# Patient Record
Sex: Male | Born: 1990 | Race: Black or African American | Hispanic: No | Marital: Single | Smoking: Former smoker
Health system: Southern US, Community
[De-identification: ages and names within clinical notes are randomized; demographics above are authoritative.]

## PROBLEM LIST (undated history)

## (undated) DIAGNOSIS — F901 Attention-deficit hyperactivity disorder, predominantly hyperactive type: Secondary | ICD-10-CM

## (undated) DIAGNOSIS — J45909 Unspecified asthma, uncomplicated: Secondary | ICD-10-CM

---
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
## 2012-07-09 ENCOUNTER — Emergency Department (INDEPENDENT_AMBULATORY_CARE_PROVIDER_SITE_OTHER)
Admission: EM | Admit: 2012-07-09 | Discharge: 2012-07-09 | Disposition: A | Discharge status: Home / Self Care | Payer: Medicaid Other | Source: Home / Self Care

## 2012-07-09 ENCOUNTER — Encounter (HOSPITAL_COMMUNITY): Payer: Self-pay | Admitting: *Deleted

## 2012-07-09 DIAGNOSIS — B349 Viral infection, unspecified: Secondary | ICD-10-CM

## 2012-07-09 DIAGNOSIS — B9789 Other viral agents as the cause of diseases classified elsewhere: Secondary | ICD-10-CM

## 2012-07-09 DIAGNOSIS — R197 Diarrhea, unspecified: Secondary | ICD-10-CM

## 2012-07-09 DIAGNOSIS — J111 Influenza due to unidentified influenza virus with other respiratory manifestations: Secondary | ICD-10-CM

## 2012-07-09 HISTORY — DX: Unspecified asthma, uncomplicated: J45.909

## 2012-07-09 HISTORY — DX: Attention-deficit hyperactivity disorder, predominantly hyperactive type: F90.1

## 2012-07-09 LAB — POCT RAPID STREP A: Streptococcus, Group A Screen (Direct): NEGATIVE

## 2012-07-09 MED ORDER — KETOROLAC TROMETHAMINE 60 MG/2ML IM SOLN
60.0000 mg | Freq: Once | INTRAMUSCULAR | Status: AC
  Administered 2012-07-09: 60 mg via INTRAMUSCULAR

## 2012-07-09 MED ORDER — GUAIFENESIN-CODEINE 100-10 MG/5ML PO SYRP: 5.0000 mL | ORAL_SOLUTION | Freq: Four times a day (QID) | ORAL | Status: DC | PRN

## 2012-07-09 NOTE — ED Provider Notes (Signed)
Medical screening examination/treatment/procedure(s) were performed by resident physician or non-physician practitioner and as supervising physician I was immediately available for consultation/collaboration.   Alireza Pollack DOUGLAS MD.    Dieter Hane D Jaceyon Strole, MD 07/09/12 1848 

## 2012-07-09 NOTE — Discharge Instructions (Signed)
Clear Liquid Diet The clear liquid dietconsists of foods that are liquid or will become liquid at room temperature.You should be able to see through the liquid and beverages. Examples of foods allowed on a clear liquid diet include fruit juice, broth or bouillon, gelatin, or frozen ice pops. The purpose of this diet is to provide necessary fluid, electrolytes such as sodium and potassium, and energy to keep the body functioning during times when you are not able to consume a regular diet.A clear liquid diet should not be continued for long periods of time as it is not nutritionally adequate.  REASONS FOR USING A CLEAR LIQUID DIET  In sudden onset (acute) conditions for a patient before or after surgery.  As the first step in oral feeding.  For fluid and electrolyte replacement in diarrheal diseases.  As a diet before certain medical tests are performed. ADEQUACY The clear liquid diet is adequate only in ascorbic acid, according to the Recommended Dietary Allowances of the [employer]. CHOOSING FOODS Breads and Starches  Allowed:  None are allowed.  Avoid: All are avoided. Vegetables  Allowed:  Strained tomato or vegetable juice.  Avoid: Any others. Fruit  Allowed:  Strained fruit juices and fruit drinks. Include 1 serving of citrus or vitamin C-enriched fruit juice daily.  Avoid: Any others. Meat and Meat Substitutes  Allowed:  None are allowed.  Avoid: All are avoided. Milk  Allowed:  None are allowed.  Avoid: All are avoided. Soups and Combination Foods  Allowed:  Clear bouillon, broth, or strained broth-based soups.  Avoid: Any others. Desserts and Sweets  Allowed:  Sugar, honey. High protein gelatin. Flavored gelatin, ices, or frozen ice pops that do not contain milk.  Avoid: Any others. Fats and Oils  Allowed:  None are allowed.  Avoid: All are avoided. Beverages  Allowed: Cereal beverages, coffee (regular or decaffeinated), tea, or soda  at the discretion of your caregiver.  Avoid: Any others. Condiments  Allowed:  Iodized salt.  Avoid: Any others, including pepper. Supplements  Allowed:  Liquid nutrition beverages.  Avoid: Any others that contain lactose or fiber. SAMPLE MEAL PLAN Breakfast  4 oz (120 mL) strained orange juice.   to 1 cup (125 to 250 mL) gelatin (plain or fortified).  1 cup (250 mL) beverage (coffee or tea).  Sugar, if desired. Midmorning Snack   cup (125 mL) gelatin (plain or fortified). Lunch  1 cup (250 mL) broth or consomm.  4 oz (120 mL) strained grapefruit juice.   cup (125 mL) gelatin (plain or fortified).  1 cup (250 mL) beverage (coffee or tea).  Sugar, if desired. Midafternoon Snack   cup (125 mL) fruit ice.   cup (125 mL) strained fruit juice. Dinner  1 cup (250 mL) broth or consomm.   cup (125 mL) cranberry juice.   cup (125 mL) flavored gelatin (plain or fortified).  1 cup (250 mL) beverage (coffee or tea).  Sugar, if desired. Evening Snack  4 oz (120 mL) strained apple juice (vitamin C-fortified).   cup (125 mL) flavored gelatin (plain or fortified). Document Released: 07/11/2005 Document Revised: 10/03/2011 Document Reviewed: 10/08/2010 [facility] Patient Information 2013 [place], [place]. B.R.A.T. Diet Your doctor has recommended the B.R.A.T. diet for you or your child until the condition improves. This is often used to help control diarrhea and vomiting symptoms. If you or your child can tolerate clear liquids, you may have:  Bananas.   Rice.   Applesauce.   Toast (and other simple starches  such as crackers, potatoes, noodles).  Be sure to avoid dairy products, meats, and fatty foods until symptoms are better. Fruit juices such as apple, grape, and prune juice can make diarrhea worse. Avoid these. Continue this diet for 2 days or as instructed by your caregiver. Document Released: 07/11/2005 Document Revised: 06/30/2011 Document Reviewed:  12/28/2006 [facility] Patient Information 2012 [place], [place].Influenza Facts Flu (influenza) is a contagious respiratory illness caused by the influenza viruses. It can cause mild to severe illness. While most healthy people recover from the flu without specific treatment and without complications, older people, young children, and people with certain health conditions are at higher risk for serious complications from the flu, including death. CAUSES   The flu virus is spread from person to person by respiratory droplets from coughing and sneezing.  A person can also become infected by touching an object or surface with a virus on it and then touching their mouth, eye or nose.  Adults may be able to infect others from 1 day before symptoms occur and up to 7 days after getting sick. So it is possible to give someone the flu even before you know you are sick and continue to infect others while you are sick. SYMPTOMS   Fever (usually high).  Headache.  Tiredness (can be extreme).  Cough.  Sore throat.  Runny or stuffy nose.  Body aches.  Diarrhea and vomiting may also occur, particularly in children.  These symptoms are referred to as "flu-like symptoms". A lot of different illnesses, including the common cold, can have similar symptoms. DIAGNOSIS   There are tests that can determine if you have the flu as long you are tested within the first 2 or 3 days of illness.  A doctor's exam and additional tests may be needed to identify if you have a disease that is a complicating the flu. RISKS AND COMPLICATIONS  Some of the complications caused by the flu include:  Bacterial pneumonia or progressive pneumonia caused by the flu virus.  Loss of body fluids (dehydration).  Worsening of chronic medical conditions, such as heart failure, asthma, or diabetes.  Sinus problems and ear infections. HOME CARE INSTRUCTIONS   Seek medical care early on.  If you are at high risk from  complications of the flu, consult your health-care provider as soon as you develop flu-like symptoms. Those at high risk for complications include:  People 65 years or older.  People with chronic medical conditions, including diabetes.  Pregnant women.  Young children.  Your caregiver may recommend use of an antiviral medication to help treat the flu.  If you get the flu, get plenty of rest, drink a lot of liquids, and avoid using alcohol and tobacco.  You can take over-the-counter medications to relieve the symptoms of the flu if your caregiver approves. (Never give aspirin to children or teenagers who have flu-like symptoms, particularly fever). PREVENTION  The single best way to prevent the flu is to get a flu vaccine each fall. Other measures that can help protect against the flu are:  Antiviral Medications  A number of antiviral drugs are approved for use in preventing the flu. These are prescription medications, and a doctor should be consulted before they are used.  Habits for Good Health  Cover your nose and mouth with a tissue when you cough or sneeze, throw the tissue away after you use it.  Wash your hands often with soap and water, especially after you cough or sneeze. If you are  not near water, use an alcohol-based hand cleaner.  Avoid people who are sick.  If you get the flu, stay home from work or school. Avoid contact with other people so that you do not make them sick, too.  Try not to touch your eyes, nose, or mouth as germs ore often spread this way. IN CHILDREN, EMERGENCY WARNING SIGNS THAT NEED URGENT MEDICAL ATTENTION:  Fast breathing or trouble breathing.  Bluish skin color.  Not drinking enough fluids.  Not waking up or not interacting.  Being so irritable that the child does not want to be held.  Flu-like symptoms improve but then return with fever and worse cough.  Fever with a rash. IN ADULTS, EMERGENCY WARNING SIGNS THAT NEED URGENT MEDICAL  ATTENTION:  Difficulty breathing or shortness of breath.  Pain or pressure in the chest or abdomen.  Sudden dizziness.  Confusion.  Severe or persistent vomiting. SEEK IMMEDIATE MEDICAL CARE IF:  You or someone you know is experiencing any of the symptoms above. When you arrive at the emergency center,report that you think you have the flu. You may be asked to wear a mask and/or sit in a secluded area to protect others from getting sick. MAKE SURE YOU:   Understand these instructions.  Monitor your condition.  Seek medical care if you are getting worse, or not improving. Document Released: 07/14/2003 Document Revised: 10/03/2011 Document Reviewed: 04/09/2009 [facility] Patient Information 2013 [place], [place]. Influenza Facts Flu (influenza) is a contagious respiratory illness caused by the influenza viruses. It can cause mild to severe illness. While most healthy people recover from the flu without specific treatment and without complications, older people, young children, and people with certain health conditions are at higher risk for serious complications from the flu, including death. CAUSES   The flu virus is spread from person to person by respiratory droplets from coughing and sneezing.  A person can also become infected by touching an object or surface with a virus on it and then touching their mouth, eye or nose.  Adults may be able to infect others from 1 day before symptoms occur and up to 7 days after getting sick. So it is possible to give someone the flu even before you know you are sick and continue to infect others while you are sick. SYMPTOMS   Fever (usually high).  Headache.  Tiredness (can be extreme).  Cough.  Sore throat.  Runny or stuffy nose.  Body aches.  Diarrhea and vomiting may also occur, particularly in children.  These symptoms are referred to as "flu-like symptoms". A lot of different illnesses, including the common cold, can have  similar symptoms. DIAGNOSIS   There are tests that can determine if you have the flu as long you are tested within the first 2 or 3 days of illness.  A doctor's exam and additional tests may be needed to identify if you have a disease that is a complicating the flu. RISKS AND COMPLICATIONS  Some of the complications caused by the flu include:  Bacterial pneumonia or progressive pneumonia caused by the flu virus.  Loss of body fluids (dehydration).  Worsening of chronic medical conditions, such as heart failure, asthma, or diabetes.  Sinus problems and ear infections. HOME CARE INSTRUCTIONS   Seek medical care early on.  If you are at high risk from complications of the flu, consult your health-care provider as soon as you develop flu-like symptoms. Those at high risk for complications include:  People 65 years or  older.  People with chronic medical conditions, including diabetes.  Pregnant women.  Young children.  Your caregiver may recommend use of an antiviral medication to help treat the flu.  If you get the flu, get plenty of rest, drink a lot of liquids, and avoid using alcohol and tobacco.  You can take over-the-counter medications to relieve the symptoms of the flu if your caregiver approves. (Never give aspirin to children or teenagers who have flu-like symptoms, particularly fever).  Tylenol every 4 hours as needed  Rest, stay in bed next 2-3 days.  PREVENTION  The single best way to prevent the flu is to get a flu vaccine each fall. Other measures that can help protect against the flu are:  Antiviral Medications  A number of antiviral drugs are approved for use in preventing the flu. These are prescription medications, and a doctor should be consulted before they are used.  Habits for Good Health  Cover your nose and mouth with a tissue when you cough or sneeze, throw the tissue away after you use it.  Wash your hands often with soap and water, especially  after you cough or sneeze. If you are not near water, use an alcohol-based hand cleaner.  Avoid people who are sick.  If you get the flu, stay home from work or school. Avoid contact with other people so that you do not make them sick, too.  Try not to touch your eyes, nose, or mouth as germs ore often spread this way. IN CHILDREN, EMERGENCY WARNING SIGNS THAT NEED URGENT MEDICAL ATTENTION:  Fast breathing or trouble breathing.  Bluish skin color.  Not drinking enough fluids.  Not waking up or not interacting.  Being so irritable that the child does not want to be held.  Flu-like symptoms improve but then return with fever and worse cough.  Fever with a rash. IN ADULTS, EMERGENCY WARNING SIGNS THAT NEED URGENT MEDICAL ATTENTION:  Difficulty breathing or shortness of breath.  Pain or pressure in the chest or abdomen.  Sudden dizziness.  Confusion.  Severe or persistent vomiting. SEEK IMMEDIATE MEDICAL CARE IF:  You or someone you know is experiencing any of the symptoms above. When you arrive at the emergency center,report that you think you have the flu. You may be asked to wear a mask and/or sit in a secluded area to protect others from getting sick. MAKE SURE YOU:   Understand these instructions.  Monitor your condition.  Seek medical care if you are getting worse, or not improving. Document Released: 07/14/2003 Document Revised: 10/03/2011 Document Reviewed: 04/09/2009 [facility] Patient Information 2013 [place], [place].

## 2012-07-09 NOTE — ED Notes (Signed)
C/o diarrhea onset 4-5 days ago.  Had 4 watery stools yesterday but none today because he has no eaten.  C/o nausea but no vomiting.  C/o sore throat, cough, sneezing and headache onset 2 days.  Has allergy to pollen.  Gets hot every nigjht but has not checked for a fever.

## 2012-07-09 NOTE — ED Provider Notes (Signed)
History     CSN: [id number]  Arrival date & time 07/09/12  1217   None     Chief Complaint  Patient presents with  . Diarrhea    (Consider location/radiation/quality/duration/timing/severity/associated sxs/prior treatment) HPI Comments: 21 year old male presents with 4 days of headache, cough, sore throat and diarrhea and malaise. Also complaining of generalized bodyaches and feeling cold in the legs. He denies having fever or earache.   Past Medical History  Diagnosis Date  . Asthma   . ADHD (attention deficit hyperactivity disorder), predominantly hyperactive impulsive type     History reviewed. No pertinent past surgical history.  Family History  Problem Relation Age of Onset  . Hypertension Mother   . Diabetes Other   . Hypertension Other     History  Substance Use Topics  . Smoking status: Never Smoker   . Smokeless tobacco: Not on file  . Alcohol Use: Yes     Comment: sometimes      Review of Systems  Constitutional: Positive for activity change and fatigue. Negative for fever and diaphoresis.  HENT: Positive for sore throat. Negative for ear pain, facial swelling, rhinorrhea, trouble swallowing, neck pain, neck stiffness and postnasal drip.   Eyes: Negative for pain, discharge and redness.  Respiratory: Positive for cough. Negative for chest tightness and shortness of breath.   Cardiovascular: Negative.   Gastrointestinal: Positive for diarrhea. Negative for nausea, vomiting and abdominal pain.  Genitourinary: Negative.   Musculoskeletal: Negative.   Skin: Negative for rash.  Neurological: Negative.     Allergies  Review of patient's allergies indicates no known allergies.  Home Medications   Current Outpatient Rx  Name  Route  Sig  Dispense  Refill  . BC HEADACHE POWDER PO   Oral   Take by mouth.         . GUAIFENESIN-CODEINE 100-10 MG/5ML PO SYRP   Oral   Take 5 mLs by mouth 4 (four) times daily as needed for cough or congestion. 1 to 2  teaspoons every 4 hours for cough and congestion   120 mL   0     BP 135/79  Pulse 82  Temp 98.2 F (36.8 C) (Oral)  Resp 14  SpO2 100%  Physical Exam  Nursing note and vitals reviewed. Constitutional: He is oriented to person, place, and time. He appears well-developed and well-nourished. No distress.       He appears as though he does not feel well but but not toxic appearing.  HENT:       Bilateral TMs are normal Oropharynx is moderately erythematous but no exudates or swelling. Airway is widely patent   Eyes: Conjunctivae normal and EOM are normal.  Neck: Normal range of motion. Neck supple.  Cardiovascular: Normal rate, regular rhythm and normal heart sounds.   Pulmonary/Chest: Effort normal and breath sounds normal. No respiratory distress. He has no wheezes. He has no rales.  Abdominal: Soft. Bowel sounds are normal. He exhibits no distension and no mass. There is no tenderness. There is no rebound and no guarding.  Musculoskeletal: Normal range of motion. He exhibits no edema.  Lymphadenopathy:    He has no cervical adenopathy.  Neurological: He is alert and oriented to person, place, and time.  Skin: Skin is warm and dry. No rash noted.  Psychiatric: He has a normal mood and affect.    ED Course  Procedures (including critical care time)   Labs Reviewed  POCT RAPID STREP A ([facility] ONLY)  No results found.   1. Acute viral syndrome   2. Diarrhea   3. Influenza-like illness       MDM   Results for orders placed during the hospital encounter of 07/09/12  POCT RAPID STREP A ([facility] ONLY)      Component Value Range   Streptococcus, Group A Screen (Direct) NEGATIVE  NEGATIVE   Home rest in bed, drink plenty of fluids stay well hydrated Tylenol every 4 hours when necessary aches pains and the Cheratussin 1-2 teaspoons every 4 hours when necessary cough and congestion Out of work for the next 3 days. If any new symptoms problems or  worsening         Hayden Rasmussen, NP 07/09/12 1436

## 2012-07-30 ENCOUNTER — Emergency Department (INDEPENDENT_AMBULATORY_CARE_PROVIDER_SITE_OTHER)
Admission: EM | Admit: 2012-07-30 | Discharge: 2012-07-30 | Disposition: A | Discharge status: Home / Self Care | Payer: Medicaid Other | Source: Home / Self Care

## 2012-07-30 ENCOUNTER — Encounter (HOSPITAL_COMMUNITY): Payer: Self-pay | Admitting: *Deleted

## 2012-07-30 DIAGNOSIS — L0102 Bockhart's impetigo: Secondary | ICD-10-CM

## 2012-07-30 DIAGNOSIS — L738 Other specified follicular disorders: Secondary | ICD-10-CM

## 2012-07-30 MED ORDER — CEPHALEXIN 500 MG PO CAPS: 500.0000 mg | ORAL_CAPSULE | Freq: Four times a day (QID) | ORAL | Status: DC

## 2012-07-30 NOTE — ED Provider Notes (Signed)
History     CSN: [id number]  Arrival date & time 07/30/12  1143   First MD Initiated Contact with Patient 07/30/12 1320      Chief Complaint  Patient presents with  . Abscess    (Consider location/radiation/quality/duration/timing/severity/associated sxs/prior treatment) HPI Comments: 22 year old male presents with 2 small lesions to his pubic hair and scrotum. He has been shaving this area rather closely and has developed a folliculitis. There is a pustule located in the suprapubic hair region, rather small and has spontaneously ruptured on its own. Leaving a small acne-like lesion. Vesiculation occurs on the right scrotum, superficial but fluctuant. It measures approximately 1/2 cm across. There is no surrounding erythema or swelling.   Past Medical History  Diagnosis Date  . Asthma   . ADHD (attention deficit hyperactivity disorder), predominantly hyperactive impulsive type     History reviewed. No pertinent past surgical history.  Family History  Problem Relation Age of Onset  . Hypertension Mother   . Diabetes Other   . Hypertension Other     History  Substance Use Topics  . Smoking status: Never Smoker   . Smokeless tobacco: Not on file  . Alcohol Use: Yes     Comment: sometimes      Review of Systems  Constitutional: Negative.   Genitourinary: Negative.   All other systems reviewed and are negative.    Allergies  Review of patient's allergies indicates no known allergies.  Home Medications   Current Outpatient Rx  Name  Route  Sig  Dispense  Refill  . BC HEADACHE POWDER PO   Oral   Take by mouth.         . CEPHALEXIN 500 MG PO CAPS   Oral   Take 1 capsule (500 mg total) by mouth 4 (four) times daily. X 10 days   40 capsule   0   . GUAIFENESIN-CODEINE 100-10 MG/5ML PO SYRP   Oral   Take 5 mLs by mouth 4 (four) times daily as needed for cough or congestion. 1 to 2 teaspoons every 4 hours for cough and congestion   120 mL   0     BP  120/72  Pulse 82  Temp 98.2 F (36.8 C) (Oral)  Resp 16  SpO2 100%  Physical Exam  Constitutional: He is oriented to person, place, and time. He appears well-developed and well-nourished. No distress.  Neck: Normal range of motion. Neck supple.  Pulmonary/Chest: Effort normal.  Genitourinary: Penis normal.  Musculoskeletal: Normal range of motion.  Neurological: He is alert and oriented to person, place, and time. He exhibits normal muscle tone.  Skin: Skin is warm and dry. No rash noted. No erythema.       Pustules one small and located in the suprapubic hair and the second located on the lateral aspect of the scrotal skin. They are both superficial. The one on the scrotal skin is fluctuant and raised from the surface. There is no surrounding induration, erythema or lymphangitis.  Psychiatric: He has a normal mood and affect.    ED Course  INCISION AND DRAINAGE Date/Time: 07/30/2012 1:44 PM Performed by: Phineas Real, Jourdan Durbin Authorized by: Leslee Home C Consent: Verbal consent obtained. Risks and benefits: risks, benefits and alternatives were discussed Consent given by: patient Patient understanding: patient states understanding of the procedure being performed Patient identity confirmed: verbally with patient Type: cyst Anesthesia: local infiltration Local anesthetic: lidocaine 2% without epinephrine Patient sedated: no Scalpel size: 11 Incision type: single straight Complexity: simple  Drainage: purulent and bloody Drainage amount: scant Wound treatment: wound left open Packing material: none Patient tolerance: Patient tolerated the procedure well with no immediate complications. Comments: The scrotal lesion was incised and drained. Incision was made it appear to be a small follicular cyst and a small amount of purulence was expressed. The wall of the cyst remains.   (including critical care time)   Labs Reviewed  CULTURE, ROUTINE-ABSCESS   No results found.   1.  Pustular folliculitis       MDM  Keflex 500 mg 4 times a day for 10 to Keep the areas clean and dry and recommended no more shaving especially in the next week. Instructions for this or presented for his use. Use warm compresses several times during the day For any worsening, new symptoms or failure to heal may return. Recommend obtaining a primary care physician for yourself.         Hayden Rasmussen, NP 07/30/12 1346

## 2012-07-30 NOTE — ED Notes (Signed)
Pt reports 2 abscesses in groin area that have been there about 2 weeks - advised not to shave in groin area

## 2012-07-30 NOTE — ED Provider Notes (Signed)
Medical screening examination/treatment/procedure(s) were performed by non-physician practitioner and as supervising physician I was immediately available for consultation/collaboration.  Leslee Home, M.D.   Reuben Likes, MD 07/30/12 2231

## 2012-08-02 LAB — CULTURE, ROUTINE-ABSCESS: Culture: NO GROWTH

## 2014-09-06 ENCOUNTER — Emergency Department (INDEPENDENT_AMBULATORY_CARE_PROVIDER_SITE_OTHER): Payer: Medicaid Other

## 2014-09-06 ENCOUNTER — Other Ambulatory Visit (HOSPITAL_COMMUNITY)
Admission: RE | Admit: 2014-09-06 | Discharge: 2014-09-06 | Disposition: A | Discharge status: Home / Self Care | Payer: Self-pay | Source: Ambulatory Visit | Attending: Emergency Medicine | Admitting: Emergency Medicine

## 2014-09-06 ENCOUNTER — Other Ambulatory Visit (HOSPITAL_COMMUNITY)
Admission: RE | Admit: 2014-09-06 | Discharge: 2014-09-06 | Disposition: A | Discharge status: Home / Self Care | Payer: Medicaid Other | Source: Ambulatory Visit | Attending: Emergency Medicine | Admitting: Emergency Medicine

## 2014-09-06 ENCOUNTER — Encounter (HOSPITAL_COMMUNITY): Payer: Self-pay | Admitting: Emergency Medicine

## 2014-09-06 ENCOUNTER — Emergency Department (HOSPITAL_COMMUNITY)
Admission: EM | Admit: 2014-09-06 | Discharge: 2014-09-06 | Disposition: A | Discharge status: Home / Self Care | Payer: Medicaid Other | Source: Home / Self Care | Attending: Emergency Medicine | Admitting: Emergency Medicine

## 2014-09-06 DIAGNOSIS — Z113 Encounter for screening for infections with a predominantly sexual mode of transmission: Secondary | ICD-10-CM | POA: Insufficient documentation

## 2014-09-06 DIAGNOSIS — K5900 Constipation, unspecified: Secondary | ICD-10-CM

## 2014-09-06 DIAGNOSIS — R1030 Lower abdominal pain, unspecified: Secondary | ICD-10-CM

## 2014-09-06 DIAGNOSIS — S93509A Unspecified sprain of unspecified toe(s), initial encounter: Secondary | ICD-10-CM

## 2014-09-06 DIAGNOSIS — N342 Other urethritis: Secondary | ICD-10-CM

## 2014-09-06 LAB — POCT URINALYSIS DIP (DEVICE)
BILIRUBIN URINE: NEGATIVE
Glucose, UA: NEGATIVE mg/dL
Hgb urine dipstick: NEGATIVE
Ketones, ur: NEGATIVE mg/dL
Leukocytes, UA: NEGATIVE
NITRITE: NEGATIVE
PH: 7 (ref 5.0–8.0)
PROTEIN: NEGATIVE mg/dL
Specific Gravity, Urine: 1.025 (ref 1.005–1.030)
UROBILINOGEN UA: 0.2 mg/dL (ref 0.0–1.0)

## 2014-09-06 LAB — POCT I-STAT, CHEM 8
BUN: 12 mg/dL (ref 6–23)
CALCIUM ION: 1.26 mmol/L — AB (ref 1.12–1.23)
CHLORIDE: 102 mmol/L (ref 96–112)
CREATININE: 1 mg/dL (ref 0.50–1.35)
GLUCOSE: 94 mg/dL (ref 70–99)
HCT: 46 % (ref 39.0–52.0)
Hemoglobin: 15.6 g/dL (ref 13.0–17.0)
POTASSIUM: 4.4 mmol/L (ref 3.5–5.1)
SODIUM: 142 mmol/L (ref 135–145)
TCO2: 28 mmol/L (ref 0–100)

## 2014-09-06 MED ORDER — CEFTRIAXONE SODIUM 250 MG IJ SOLR
250.0000 mg | Freq: Once | INTRAMUSCULAR | Status: AC
  Administered 2014-09-06: 250 mg via INTRAMUSCULAR

## 2014-09-06 MED ORDER — CEFTRIAXONE SODIUM 250 MG IJ SOLR
INTRAMUSCULAR | Status: AC
  Filled 2014-09-06: qty 250

## 2014-09-06 MED ORDER — LIDOCAINE HCL (PF) 1 % IJ SOLN
INTRAMUSCULAR | Status: AC
  Filled 2014-09-06: qty 5

## 2014-09-06 MED ORDER — AZITHROMYCIN 250 MG PO TABS
1000.0000 mg | ORAL_TABLET | Freq: Once | ORAL | Status: AC
  Administered 2014-09-06: 1000 mg via ORAL

## 2014-09-06 MED ORDER — POLYETHYLENE GLYCOL 3350 17 GM/SCOOP PO POWD: 17.0000 g | Freq: Every day | ORAL | Status: DC

## 2014-09-06 MED ORDER — AZITHROMYCIN 250 MG PO TABS
ORAL_TABLET | ORAL | Status: AC
  Filled 2014-09-06: qty 4

## 2014-09-06 NOTE — ED Provider Notes (Signed)
Chief Complaint   Exposure to STD   History of Present Illness   Walter Gonzalez is a 24 year old male who comes in today with a number of complaints including abdominal pain, constipation, dysuria, and left great toe injury.  The abdominal pain is been going on for 3 months. It's localized to the bilateral lower abdomen without radiation. The pain comes and goes but occurs daily. Is worse when he first wakes up and then again in the evening. It's a crampy pain rated 10 over 10 in intensity. He thinks it's worse when drinking milk or eating junk food or large meals. He tried to avoid all these things and thinks the pain might be a little bit better but still going on. The pain is better after bowel movement. He denies any anorexia, but he thinks he has lost about 10 pounds. Sometimes he feels nauseated but no vomiting. He's had subjective fever and chills. No history of GI problems, ulcer disease, gallstones, or diverticulitis.  He also has had constipation for the past 1-2 weeks. Before then his bowel movements were regular. Bowel bowel movements occur about every 3 days. Stools are small, hard, pellet-like, and he has to strain. There's been no blood in the stool. He denies any change in his diet or any new medications.  He also has a two-week history of burning with urination. He denies any urethral discharge. He is sexually active. He denies any lesions on his penis or inguinal adenopathy. He has had 1 partner in the past 3 months. She recently was positive for syphilis and 2 days ago had treatment for this. He would like to be tested for syphilis as well.  Finally, the patient complains of left great toe pain. This has been going on for about a month. The patient states he stubbed his toe. It's been painful and swollen since then. It hurts to bend the DIP joint, and most of the pain is over the DIP.  Review of Systems     Other than as noted above, the patient denies any of the following  symptoms: Systemic:  No fever, chills, fatigue, myalgias, headache, or anorexia. Eye:  No redness, pain or drainage. ENT:  No earache, nasal congestion, rhinorrhea, sinus pressure, or sore throat. Lungs:  No cough, sputum production, wheezing, shortness of breath.  Cardiovascular:  No chest pain, palpitations, or syncope. GI:  No nausea, vomiting, abdominal pain or diarrhea. GU:  No dysuria, frequency, or hematuria. Skin:  No rash or pruritis.   PMFSH     Past medical history, family history, social history, meds, and allergies were reviewed.    Physical Examination    Vital signs:  BP 109/68 mmHg  Pulse 60  Temp(Src) 98.6 F (37 C) (Oral)  Resp 16  SpO2 100% General:  Alert, in no distress. Eye:  PERRL, full EOMs.  Lids and conjunctivas were normal. ENT:  TMs and canals were normal, without erythema or inflammation.  Nasal mucosa was clear and uncongested, without drainage.  Mucous membranes were moist.  Pharynx was clear, without exudate or drainage.  There were no oral ulcerations or lesions. Neck:  Supple, no adenopathy, tenderness or mass. Thyroid was normal. Lungs:  No respiratory distress.  Lungs were clear to auscultation, without wheezes, rales or rhonchi.  Breath sounds were clear and equal bilaterally. Heart:  Regular rhythm, without gallops, murmers or rubs. Abdomen:  Soft, flat, and non-tender to palpation.  No hepatosplenomagaly or mass. Genital exam: No urethral discharge, no  penile lesions, testes normal, no inguinal adenopathy. Extremities: He has pain to palpation over the inner phalangeal joint and the DIP of the left great toe. No swelling, bruising, or deformity. All joints have a full range of motion. He has pain with movement of the inner phalangeal joint. Pulses were full. Skin:  Clear, warm, and dry, without rash or lesions.  Labs    Results for orders placed or performed during the hospital encounter of 09/06/14  POCT urinalysis dip (device)  Result  Value Ref Range   Glucose, UA NEGATIVE NEGATIVE mg/dL   Bilirubin Urine NEGATIVE NEGATIVE   Ketones, ur NEGATIVE NEGATIVE mg/dL   Specific Gravity, Urine 1.025 1.005 - 1.030   Hgb urine dipstick NEGATIVE NEGATIVE   pH 7.0 5.0 - 8.0   Protein, ur NEGATIVE NEGATIVE mg/dL   Urobilinogen, UA 0.2 0.0 - 1.0 mg/dL   Nitrite NEGATIVE NEGATIVE   Leukocytes, UA NEGATIVE NEGATIVE  I-STAT, chem 8  Result Value Ref Range   Sodium 142 135 - 145 mmol/L   Potassium 4.4 3.5 - 5.1 mmol/L   Chloride 102 96 - 112 mmol/L   BUN 12 6 - 23 mg/dL   Creatinine, Ser 1.61 0.50 - 1.35 mg/dL   Glucose, Bld 94 70 - 99 mg/dL   Calcium, Ion 0.96 (H) 1.12 - 1.23 mmol/L   TCO2 28 0 - 100 mmol/L   Hemoglobin 15.6 13.0 - 17.0 g/dL   HCT 04.5 40.9 - 81.1 %     Radiology    Dg Toe Great Left  09/06/2014   CLINICAL DATA:  Pain following trauma 2 weeks prior  EXAM: LEFT FIRST TOE  COMPARISON:  None.  FINDINGS: Frontal, oblique, and lateral views were obtained. There is no fracture or dislocation. Joint spaces appear intact. No erosive change.  IMPRESSION: No fracture or dislocation.  No appreciable arthropathy.   Electronically Signed   By: Bretta Bang III M.D.   On: 09/06/2014 15:46     Course in Urgent Care Center   The following medications were given:  Medications  cefTRIAXone (ROCEPHIN) injection 250 mg (250 mg Intramuscular Given 09/06/14 1630)  azithromycin (ZITHROMAX) tablet 1,000 mg (1,000 mg Oral Given 09/06/14 1630)   Assessment   The primary encounter diagnosis was Lower abdominal pain. Diagnoses of Constipation, unspecified constipation type, Urethritis, and Toe sprain, initial encounter were also pertinent to this visit.  I think his abdominal pain is due to irritable bowel syndrome. Other possibilities would include inflammatory bowel disease, diverticulitis is less likely since he has no objective fever and the symptoms are intermittent and have been going on for a long time.  The  constipation may also be a manifestation of IBS. Will treat with increasing fiber in his diet, fluids, exercise, and MiraLAX. Suggest he follow-up with GI in about 2-3 weeks.  We have DNA probes pending for gonorrhea and chlamydia. I'm going to go ahead and treat him for this and hold off on any syphilis treatment until we know the results of his RPR. He was warned to avoid intercourse for the next week and thereafter only with condoms.  His toe x-ray was normal. I think this is a soft tissue ligament sprain and I have advised good, firm, supportive footwear.  Plan     1.  Meds:  The following meds were prescribed:   New Prescriptions   POLYETHYLENE GLYCOL POWDER (MIRALAX) POWDER    Take 17 g by mouth daily.    2.  Patient Education/Counseling:  The patient was given appropriate handouts, self care instructions, and instructed in symptomatic relief.    3.  Follow up:  The patient was told to follow up here if no better in 3 to 4 days, or sooner if becoming worse in any way, and given some red flag symptoms such as worsening abdominal pain, persistent vomiting, or blood in the stool which would prompt immediate return.  Follow up with Dr. Charlott Rakes within the next 1-2 weeks.        Reuben Likes, MD 09/06/14 [phone]

## 2014-09-06 NOTE — Discharge Instructions (Signed)
Keep a food diary.  Get extra fluids, 30 gm fiber daily and 30 minutes of exercise daily.   Wear firm, supportive shoe.

## 2014-09-06 NOTE — ED Notes (Signed)
Pt c/o abd pain and dysuria onset 3 months Reports partner is being treated for syphilis  Also reports left greater toe inj onset 3 weeks Alert, no signs of acute distress

## 2014-09-07 LAB — RPR: RPR Ser Ql: NONREACTIVE

## 2014-09-08 ENCOUNTER — Telehealth (HOSPITAL_COMMUNITY): Payer: Self-pay | Admitting: *Deleted

## 2014-09-08 LAB — URINE CYTOLOGY ANCILLARY ONLY
Chlamydia: POSITIVE — AB
Neisseria Gonorrhea: NEGATIVE
Trichomonas: NEGATIVE

## 2014-09-08 LAB — URINE CULTURE
Colony Count: 4000
Special Requests: NORMAL

## 2014-09-08 LAB — HIV ANTIBODY (ROUTINE TESTING W REFLEX): HIV SCREEN 4TH GENERATION: NONREACTIVE

## 2014-09-08 NOTE — ED Notes (Addendum)
GC and Trich neg., Chlamydia pos., HIV/RPR non-reactive, Urine culture: Insignificant growth.  I called pt. and mobile number is not in service. I called home number.  Pt. verified x 2 and given results.  Pt. told he was adequately treated with Zithromax.  Pt. instructed to notify his partner, no sex for 1 week and to practice safe sex. Pt. told he should get [facility], by appointment.  Pt. voiced understanding. Vassie MoselleYork, Shaneece Stockburger M 09/08/2014 DHHS form completed and [facility]. 09/09/2014

## 2015-01-20 ENCOUNTER — Encounter (HOSPITAL_COMMUNITY): Payer: Self-pay | Admitting: Nurse Practitioner

## 2015-01-20 ENCOUNTER — Emergency Department (HOSPITAL_COMMUNITY)
Admission: EM | Admit: 2015-01-20 | Discharge: 2015-01-20 | Disposition: A | Discharge status: Home / Self Care | Payer: Self-pay | Attending: Emergency Medicine | Admitting: Emergency Medicine

## 2015-01-20 DIAGNOSIS — Z8659 Personal history of other mental and behavioral disorders: Secondary | ICD-10-CM | POA: Insufficient documentation

## 2015-01-20 DIAGNOSIS — G8929 Other chronic pain: Secondary | ICD-10-CM | POA: Insufficient documentation

## 2015-01-20 DIAGNOSIS — J45909 Unspecified asthma, uncomplicated: Secondary | ICD-10-CM | POA: Insufficient documentation

## 2015-01-20 DIAGNOSIS — R197 Diarrhea, unspecified: Secondary | ICD-10-CM

## 2015-01-20 DIAGNOSIS — K921 Melena: Secondary | ICD-10-CM | POA: Insufficient documentation

## 2015-01-20 DIAGNOSIS — R110 Nausea: Secondary | ICD-10-CM

## 2015-01-20 DIAGNOSIS — R109 Unspecified abdominal pain: Secondary | ICD-10-CM

## 2015-01-20 LAB — CBC WITH DIFFERENTIAL/PLATELET
BASOS PCT: 1 % (ref 0–1)
Basophils Absolute: 0 10*3/uL (ref 0.0–0.1)
EOS PCT: 7 % — AB (ref 0–5)
Eosinophils Absolute: 0.3 10*3/uL (ref 0.0–0.7)
HCT: 41 % (ref 39.0–52.0)
HEMOGLOBIN: 13.6 g/dL (ref 13.0–17.0)
LYMPHS PCT: 42 % (ref 12–46)
Lymphs Abs: 1.6 10*3/uL (ref 0.7–4.0)
MCH: 23.9 pg — ABNORMAL LOW (ref 26.0–34.0)
MCHC: 33.2 g/dL (ref 30.0–36.0)
MCV: 71.9 fL — ABNORMAL LOW (ref 78.0–100.0)
MONO ABS: 0.4 10*3/uL (ref 0.1–1.0)
MONOS PCT: 12 % (ref 3–12)
NEUTROS PCT: 38 % — AB (ref 43–77)
Neutro Abs: 1.4 10*3/uL — ABNORMAL LOW (ref 1.7–7.7)
PLATELETS: 425 10*3/uL — AB (ref 150–400)
RBC: 5.7 MIL/uL (ref 4.22–5.81)
RDW: 14.7 % (ref 11.5–15.5)
WBC: 3.7 10*3/uL — AB (ref 4.0–10.5)

## 2015-01-20 LAB — COMPREHENSIVE METABOLIC PANEL
ALT: 11 U/L — AB (ref 17–63)
ANION GAP: 7 (ref 5–15)
AST: 16 U/L (ref 15–41)
Albumin: 4.3 g/dL (ref 3.5–5.0)
Alkaline Phosphatase: 83 U/L (ref 38–126)
BUN: 14 mg/dL (ref 6–20)
CALCIUM: 9.4 mg/dL (ref 8.9–10.3)
CO2: 27 mmol/L (ref 22–32)
CREATININE: 1.02 mg/dL (ref 0.61–1.24)
Chloride: 102 mmol/L (ref 101–111)
GLUCOSE: 90 mg/dL (ref 65–99)
POTASSIUM: 4.4 mmol/L (ref 3.5–5.1)
Sodium: 136 mmol/L (ref 135–145)
TOTAL PROTEIN: 7.8 g/dL (ref 6.5–8.1)
Total Bilirubin: 0.6 mg/dL (ref 0.3–1.2)

## 2015-01-20 LAB — POC OCCULT BLOOD, ED: Fecal Occult Bld: NEGATIVE

## 2015-01-20 LAB — URINALYSIS, ROUTINE W REFLEX MICROSCOPIC
Bilirubin Urine: NEGATIVE
GLUCOSE, UA: NEGATIVE mg/dL
Hgb urine dipstick: NEGATIVE
Ketones, ur: NEGATIVE mg/dL
LEUKOCYTES UA: NEGATIVE
NITRITE: NEGATIVE
PH: 6 (ref 5.0–8.0)
PROTEIN: NEGATIVE mg/dL
SPECIFIC GRAVITY, URINE: 1.029 (ref 1.005–1.030)
Urobilinogen, UA: 0.2 mg/dL (ref 0.0–1.0)

## 2015-01-20 LAB — LIPASE, BLOOD: Lipase: 25 U/L (ref 22–51)

## 2015-01-20 MED ORDER — ONDANSETRON HCL 8 MG PO TABS: 8.0000 mg | ORAL_TABLET | Freq: Three times a day (TID) | ORAL | Status: DC | PRN

## 2015-01-20 MED ORDER — ONDANSETRON 4 MG PO TBDP
8.0000 mg | ORAL_TABLET | Freq: Once | ORAL | Status: AC
  Administered 2015-01-20: 8 mg via ORAL
  Filled 2015-01-20: qty 2

## 2015-01-20 MED ORDER — HYDROCODONE-ACETAMINOPHEN 5-325 MG PO TABS
1.0000 | ORAL_TABLET | Freq: Once | ORAL | Status: AC
  Administered 2015-01-20: 1 via ORAL
  Filled 2015-01-20: qty 1

## 2015-01-20 NOTE — ED Provider Notes (Signed)
CSN: [id number]     Arrival date & time 01/20/15  1522 History  This chart was scribed for non-physician practitioner, Allen Derry, PA-C working with Bethann Berkshire, MD, by Abel Presto, ED Scribe. This patient was seen in room TR09C/TR09C and the patient's care was started at 4:52 PM.      Chief Complaint  Patient presents with  . Abdominal Pain     Patient is a 24 y.o. male presenting with abdominal pain. The history is provided by the patient. No language interpreter was used.  Abdominal Pain Pain location:  LLQ and RLQ Pain quality: sharp   Pain radiates to:  Does not radiate Pain severity:  Moderate Onset quality:  Gradual Duration:  12 weeks Timing:  Intermittent Progression:  Worsening Chronicity:  New Context: not recent sexual activity, not recent travel, not sick contacts and not suspicious food intake   Relieved by:  Bowel activity Worsened by:  Nothing tried Ineffective treatments:  None tried Associated symptoms: chills, diarrhea and nausea   Associated symptoms: no chest pain, no constipation, no dysuria, no fever, no flatus, no hematochezia, no hematuria, no melena, no shortness of breath and no vomiting   Diarrhea:    Quality:  Semi-solid   Severity:  Moderate   Duration:  12 weeks   Timing:  Sporadic   Progression:  Unchanged Nausea:    Severity:  Unable to specify   Onset quality:  Unable to specify   Duration:  12 months   Timing:  Intermittent   Progression:  Unchanged Risk factors: no alcohol abuse and no NSAID use    HPI Comments: Walter CASHER is a 24 y.o. male with PMHx of asthma and ADHD, who presents to the Emergency Department complaining of intermittent 8/10 sharp lower abdominal pain with onset 2-3 months ago. Pt notes associated nausea, intermittent chills, intermittent "semi-solid" diarrhea, and intermittent hematochezia. Pt reports clots passed with defecation last night. Pt denies any aggravating factors. He states defecating  alleviates the pain. Pt was seen in February at Urgent Care for similar complaint and diagnosed and treated for chlamydia. Pt reports current abdominal pain is worse than then. Pt denies suspicious foods, travel outside the country, recent sick contacts, frequent NSAID use, and recent Abx use. Pt reports increased social EtOH use, mostly on the weekends. Pt states he is sexually active with females and protected each time. Pt denies fever, vomiting, constipation, obstipation, melena, chest pain, SOB, difficulty urinating, penile discharge, testicular pain or swelling, dysuria, hematuria, current rectal pain, numbness, and weakness.    Past Medical History  Diagnosis Date  . Asthma   . ADHD (attention deficit hyperactivity disorder), predominantly hyperactive impulsive type    History reviewed. No pertinent past surgical history. Family History  Problem Relation Age of Onset  . Hypertension Mother   . Diabetes Other   . Hypertension Other    History  Substance Use Topics  . Smoking status: Never Smoker   . Smokeless tobacco: Not on file  . Alcohol Use: Yes     Comment: sometimes    Review of Systems  Constitutional: Positive for chills. Negative for fever.  Respiratory: Negative for shortness of breath.   Cardiovascular: Negative for chest pain.  Gastrointestinal: Positive for nausea, abdominal pain, diarrhea, blood in stool (intermittently) and rectal pain (intermittently). Negative for vomiting, constipation, melena, hematochezia and flatus.  Genitourinary: Negative for dysuria, hematuria, discharge, penile swelling, scrotal swelling, difficulty urinating, penile pain and testicular pain.  Musculoskeletal: Negative for myalgias and  arthralgias.  Allergic/Immunologic: Negative for immunocompromised state.  Neurological: Negative for weakness, light-headedness and numbness.  Hematological: Does not bruise/bleed easily.  Psychiatric/Behavioral: Negative for confusion.   A complete 10  system review of systems was obtained and all systems are negative except as noted in the HPI and PMH.    Allergies  Review of patient's allergies indicates no known allergies.  Home Medications   Prior to Admission medications   Medication Sig Start Date End Date Taking? Authorizing Provider  diphenhydrAMINE (BENADRYL) 25 MG tablet Take 25 mg by mouth every 6 (six) hours as needed for allergies.   Yes Historical Provider, MD  cephALEXin (KEFLEX) 500 MG capsule Take 1 capsule (500 mg total) by mouth 4 (four) times daily. X 10 days Patient not taking: Reported on 01/20/2015 07/30/12   Hayden Rasmussen, NP  guaiFENesin-codeine (CHERATUSSIN AC) 100-10 MG/5ML syrup Take 5 mLs by mouth 4 (four) times daily as needed for cough or congestion. 1 to 2 teaspoons every 4 hours for cough and congestion Patient not taking: Reported on 01/20/2015 07/09/12   Hayden Rasmussen, NP  polyethylene glycol powder (MIRALAX) powder Take 17 g by mouth daily. Patient not taking: Reported on 01/20/2015 09/06/14   Reuben Likes, MD   BP 143/77 mmHg  Pulse 54  Temp(Src) 98.1 F (36.7 C) (Oral)  Resp 16  Ht  (1.905 m)  Wt 157 lb (71.215 kg)  BMI 19.62 kg/m2  SpO2 99% Physical Exam  Constitutional: He is oriented to person, place, and time. Vital signs are normal. He appears well-developed and well-nourished.  Non-toxic appearance. No distress.  Afebrile, nontoxic, NAD  HENT:  Head: Normocephalic and atraumatic.  Mouth/Throat: Oropharynx is clear and moist and mucous membranes are normal.  Eyes: Conjunctivae and EOM are normal. Right eye exhibits no discharge. Left eye exhibits no discharge.  Neck: Normal range of motion. Neck supple.  Cardiovascular: Normal rate, regular rhythm, normal heart sounds and intact distal pulses.  Exam reveals no gallop and no friction rub.   No murmur heard. Pulmonary/Chest: Effort normal and breath sounds normal. No respiratory distress. He has no decreased breath sounds. He has no wheezes.  He has no rhonchi. He has no rales.  Abdominal: Soft. Normal appearance and bowel sounds are normal. He exhibits no distension. There is tenderness in the right lower quadrant, epigastric area and left lower quadrant. There is no rigidity, no rebound, no guarding, no CVA tenderness, no tenderness at McBurney's point and negative Murphy's sign.    Soft, nondistended, +BS throughout, with very mild epigastric, RLQ, and LLQ TTP, no r/g/r, neg murphy's, neg mcburney's, no CVA TTP   Genitourinary: Prostate normal. Rectal exam shows internal hemorrhoid. Rectal exam shows no external hemorrhoid, no fissure, no mass, no tenderness and anal tone normal. Guaiac negative stool.  Chaperone present No gross blood noted on rectal exam, normal tone, no tenderness, no mass or fissure, no external hemorrhoids, palpable internal hemorrhoid, FOBT neg. Prostate without enlargement or tenderness, no warmth.   Musculoskeletal: Normal range of motion.  Neurological: He is alert and oriented to person, place, and time. He has normal strength. No sensory deficit.  Skin: Skin is warm, dry and intact. No rash noted.  Psychiatric: He has a normal mood and affect.  Nursing note and vitals reviewed.   ED Course  Procedures (including critical care time) DIAGNOSTIC STUDIES: Oxygen Saturation is 99% on room air, normal by my interpretation.    COORDINATION OF CARE: 5:00 PM Discussed treatment plan with  patient at beside, the patient agrees with the plan and has no further questions at this time.   Labs Review Labs Reviewed  CBC WITH DIFFERENTIAL/PLATELET - Abnormal; Notable for the following:    WBC 3.7 (*)    MCV 71.9 (*)    MCH 23.9 (*)    Platelets 425 (*)    Neutrophils Relative % 38 (*)    Eosinophils Relative 7 (*)    Neutro Abs 1.4 (*)    All other components within normal limits  COMPREHENSIVE METABOLIC PANEL - Abnormal; Notable for the following:    ALT 11 (*)    All other components within normal  limits  URINALYSIS, ROUTINE W REFLEX MICROSCOPIC (NOT AT [facility])  LIPASE, BLOOD  HIV ANTIBODY (ROUTINE TESTING)  RPR  POC OCCULT BLOOD, ED  GC/CHLAMYDIA PROBE AMP ([place]) NOT AT [facility]    Imaging Review No results found.   EKG Interpretation None      MDM   Final diagnoses:  Chronic abdominal pain  Nausea  Diarrhea  Hematochezia    24 y.o. male here with ongoing lower abd pain and diarrhea with intermittent hematochezia, last night with clots. Has been seen at urgent care previously with similar complaints, work up revealed chlamydia which he was treated for. Abd exam reveals mild epigastric tenderness as well as bilateral lower quadrant tenderness, non-peritoneal. Rectal exam reveals palpable internal hemorrhoid, no prostatic tenderness or warmth, low suspicion for prostatitis. Symptoms could be correlated with IBS, but will obtain lab work, UA, GC/CT, STD testing, and occult blood card. Will give Zofran and pain medication by mouth. Will reassess after lab work.  I personally performed the services described in this documentation, which was scribed in my presence. The recorded information has been reviewed and is accurate.  7:08 PM FOBT neg. Lipase WNL. U/A clear, less concerning for GC/CT, will let results return but not empirically treat since pt states he's using protection with sexual partners and no urethritis on U/A. CBC w/diff relatively unremarkable. CMP unremarkable. Pt feeling improved. Will have him use tylenol/motrin, and alcohol cessation to help with pain. Will send home with zofran. Tolerating PO well here. Discussed that ultimately he needs to see GI for further eval and care of this chronic abdominal pain and diarrhea. I explained the diagnosis and have given explicit precautions to return to the ER including for any other new or worsening symptoms. The patient understands and accepts the medical plan as it's been dictated and I have answered their questions.  Discharge instructions concerning home care and prescriptions have been given. The patient is STABLE and is discharged to home in good condition.  BP 136/82 mmHg  Pulse 52  Temp(Src) 98.1 F (36.7 C) (Oral)  Resp 18  Ht 6\' 3"  (1.905 m)  Wt 157 lb (71.215 kg)  BMI 19.62 kg/m2  SpO2 97%  Meds ordered this encounter  Medications  . ondansetron (ZOFRAN-ODT) disintegrating tablet 8 mg    Sig:   . HYDROcodone-acetaminophen (NORCO/VICODIN) 5-325 MG per tablet 1 tablet    Sig:   . ondansetron (ZOFRAN) 8 MG tablet    Sig: Take 1 tablet (8 mg total) by mouth every 8 (eight) hours as needed for nausea or vomiting.    Dispense:  10 tablet    Refill:  0    Order Specific Question:  Supervising Provider    Answer:  Eber HongMILLER, BRIAN [address][id number]]      Nylene Inlow Camprubi-Soms, PA-C 01/20/15 1910  Bethann BerkshireJoseph Zammit, MD 01/23/15 1352

## 2015-01-20 NOTE — Discharge Instructions (Signed)
Your abdominal pain may be due to IBS (irritable bowel syndrome) but this cannot be definitively diagnosed in the emergency room. You will need to follow up with the gastroenterologist for further care. Use zofran as needed for nausea, and tylenol or motrin as needed for pain. Stay well hydrated. Avoid drinking alcohol. Follow up with the GI doctor in 1 week. Return to the ER for changes or worsening symptoms.  Abdominal (belly) pain can be caused by many things. Your caregiver performed an examination and possibly ordered blood/urine tests and imaging (CT scan, x-rays, ultrasound). Many cases can be observed and treated at home after initial evaluation in the emergency department. Even though you are being discharged home, abdominal pain can be unpredictable. Therefore, you need a repeated exam if your pain does not resolve, returns, or worsens. Most patients with abdominal pain don't have to be admitted to the hospital or have surgery, but serious problems like appendicitis and gallbladder attacks can start out as nonspecific pain. Many abdominal conditions cannot be diagnosed in one visit, so follow-up evaluations are very important. SEEK IMMEDIATE MEDICAL ATTENTION IF YOU DEVELOP ANY OF THE FOLLOWING SYMPTOMS:  The pain does not go away or becomes severe.   A temperature above 101 develops.   Repeated vomiting occurs (multiple episodes).   The pain becomes localized to portions of the abdomen. The right side could possibly be appendicitis. In an adult, the left lower portion of the abdomen could be colitis or diverticulitis.   Blood is being passed in stools or vomit (bright red or black tarry stools).   Return also if you develop chest pain, difficulty breathing, dizziness or fainting, or become confused, poorly responsive, or inconsolable (young children).  The constipation stays for more than 4 days.   There is belly (abdominal) or rectal pain.   You do not seem to be getting better.      Abdominal Pain Many things can cause belly (abdominal) pain. Most times, the belly pain is not dangerous. Many cases of belly pain can be watched and treated at home. HOME CARE   Do not take medicines that help you go poop (laxatives) unless told to by your doctor.  Only take medicine as told by your doctor.  Eat or drink as told by your doctor. Your doctor will tell you if you should be on a special diet. GET HELP IF:  You do not know what is causing your belly pain.  You have belly pain while you are sick to your stomach (nauseous) or have runny poop (diarrhea).  You have pain while you pee or poop.  Your belly pain wakes you up at night.  You have belly pain that gets worse or better when you eat.  You have belly pain that gets worse when you eat fatty foods.  You have a fever. GET HELP RIGHT AWAY IF:   The pain does not go away within 2 hours.  You keep throwing up (vomiting).  The pain changes and is only in the right or left part of the belly.  You have bloody or tarry looking poop. MAKE SURE YOU:   Understand these instructions.  Will watch your condition.  Will get help right away if you are not doing well or get worse. Document Released: 12/28/2007 Document Revised: 07/16/2013 Document Reviewed: 03/20/2013 [facility] Patient Information 2015 [place], [place]. This information is not intended to replace advice given to you by your health care provider. Make sure you discuss any questions you have  with your health care provider.   Chronic Diarrhea Diarrhea is frequent loose and watery bowel movements. It can cause you to feel weak and dehydrated. Dehydration can cause you to become tired and thirsty and to have a dry mouth, decreased urination, and dark yellow urine. Diarrhea is a sign of another problem, most often an infection that will not last long. In most cases, diarrhea lasts 2-3 days. Diarrhea that lasts longer than 4 weeks is called long-lasting  (chronic) diarrhea. It is important to treat your diarrhea as directed by your health care provider to lessen or prevent future episodes of diarrhea.  CAUSES  There are many causes of chronic diarrhea. The following are some possible causes:   Gastrointestinal infections caused by viruses, bacteria, or parasites.   Food poisoning or food allergies.   Certain medicines, such as antibiotics, chemotherapy, and laxatives.   Artificial sweeteners and fructose.   Digestive disorders, such as celiac disease and inflammatory bowel diseases.   Irritable bowel syndrome.  Some disorders of the pancreas.  Disorders of the thyroid.  Reduced blood flow to the intestines.  Cancer. Sometimes the cause of chronic diarrhea is unknown. RISK FACTORS  Having a severely weakened immune system, such as from HIV or AIDS.   Taking certain types of cancer-fighting drugs (such as with chemotherapy) or other medicines.   Having had a recent organ transplant.   Having a portion of the stomach or small bowel removed.   Traveling to countries where food and water supplies are often contaminated.  SYMPTOMS  In addition to frequent, loose stools, diarrhea may cause:   Cramping.   Abdominal pain.   Nausea.   Fever.  Fatigue.  Urgent need to use the bathroom.  Loss of bowel control. DIAGNOSIS  Your health care provider must take a careful history and perform a physical exam. Tests given are based on your symptoms and history. Tests may include:   Blood or stool tests. Three or more stool samples may be examined. Stool cultures may be used to test for bacteria or parasites.   X-rays.   A procedure in which a thin tube is inserted into the mouth or rectum (endoscopy). This allows the health care provider to look inside the intestine.  TREATMENT   Treatment is aimed at correcting the cause of the diarrhea when possible.  Diarrhea caused by an infection can often be treated  with antibiotic medicines.  Diarrhea not caused by an infection may require you to take long-term medicine or have surgery. Specific treatment should be discussed with your health care provider.  If the cause cannot be determined, treatment aims to relieve symptoms and prevent dehydration. Serious health problems can occur if you do not maintain proper fluid levels. Treatment may include:  Taking an oral rehydration solution (ORS).  Not drinking beverages that contain caffeine (such as tea, coffee, and soft drinks).  Not drinking alcohol.  Maintaining well-balanced nutrition to help you recover faster. HOME CARE INSTRUCTIONS   Drink enough fluids to keep urine clear or pale yellow. Drink 1 cup (8 oz) of fluid for each diarrhea episode. Avoid fluids that contain simple sugars, fruit juices, whole milk products, and sodas. Hydrate with an ORS. You may purchase the ORS or prepare it at home by mixing the following ingredients together:   - tsp (1.7-3  mL) table salt.   tsp (3  mL) baking soda.   tsp (1.7 mL) salt substitute containing potassium chloride.  1 tbsp (20 mL) sugar.  4.2 c (1 L) of water.   Certain foods and beverages may increase the speed at which food moves through the gastrointestinal (GI) tract. These foods and beverages should be avoided. They include:  Caffeinated and alcoholic beverages.  High-fiber foods, such as raw fruits and vegetables, nuts, seeds, and whole grain breads and cereals.  Foods and beverages sweetened with sugar alcohols, such as xylitol, sorbitol, and mannitol.   Some foods may be well tolerated and may help thicken stool. These include:  Starchy foods, such as rice, toast, pasta, low-sugar cereal, oatmeal, grits, baked potatoes, crackers, and bagels.  Bananas.  Applesauce.  Add probiotic-rich foods to help increase healthy bacteria in the GI tract. These include yogurt and fermented milk products.  Wash your hands well after each  diarrhea episode.  Only take over-the-counter or prescription medicines as directed by your health care provider.  Take a warm bath to relieve any burning or pain from frequent diarrhea episodes. SEEK MEDICAL CARE IF:   You are not urinating as often.  Your urine is a dark color.  You become very tired or dizzy.  You have severe pain in the abdomen or rectum.  Your have blood or pus in your stools.  Your stools look black and tarry. SEEK IMMEDIATE MEDICAL CARE IF:   You are unable to keep fluids down.  You have persistent vomiting.  You have blood in your stool.  Your stools are black and tarry.  You do not urinate in 6-8 hours, or there is only a small amount of very dark urine.  You have abdominal pain that increases or localizes.  You have weakness, dizziness, confusion, or lightheadedness.  You have a severe headache.  Your diarrhea gets worse or does not get better.  You have a fever or persistent symptoms for more than 2-3 days.  You have a fever and your symptoms suddenly get worse. MAKE SURE YOU:   Understand these instructions.  Will watch your condition.  Will get help right away if you are not doing well or get worse. Document Released: 10/01/2003 Document Revised: 07/16/2013 Document Reviewed: 01/03/2013 [facility] Patient Information 2015 [place], [place]. This information is not intended to replace advice given to you by your health care provider. Make sure you discuss any questions you have with your health care provider.

## 2015-01-20 NOTE — ED Notes (Signed)
Pt states that he has had 2 to 3 months of abdominal pain. Last night he states that he had diarrhea with noticeable blood clots. States his stomach has been on fire since last night

## 2015-01-20 NOTE — ED Notes (Signed)
Pt reports lower abd pain every morning when he wakes for past 2 months. After he has BM the pain is better for the day. He has noticed blood in his stools and nausea intermittently also.

## 2015-01-21 LAB — RPR: RPR Ser Ql: NONREACTIVE

## 2015-01-21 LAB — GC/CHLAMYDIA PROBE AMP (~~LOC~~) NOT AT ARMC
CHLAMYDIA, DNA PROBE: NEGATIVE
Neisseria Gonorrhea: NEGATIVE

## 2015-01-21 LAB — HIV ANTIBODY (ROUTINE TESTING W REFLEX): HIV Screen 4th Generation wRfx: NONREACTIVE

## 2015-02-27 ENCOUNTER — Emergency Department (HOSPITAL_COMMUNITY): Payer: Self-pay

## 2015-02-27 ENCOUNTER — Encounter (HOSPITAL_COMMUNITY): Payer: Self-pay | Admitting: Emergency Medicine

## 2015-02-27 ENCOUNTER — Emergency Department (HOSPITAL_COMMUNITY)
Admission: EM | Admit: 2015-02-27 | Discharge: 2015-02-28 | Disposition: A | Discharge status: Home / Self Care | Payer: Self-pay | Attending: Emergency Medicine | Admitting: Emergency Medicine

## 2015-02-27 DIAGNOSIS — Z8659 Personal history of other mental and behavioral disorders: Secondary | ICD-10-CM | POA: Insufficient documentation

## 2015-02-27 DIAGNOSIS — Y998 Other external cause status: Secondary | ICD-10-CM | POA: Insufficient documentation

## 2015-02-27 DIAGNOSIS — Y9367 Activity, basketball: Secondary | ICD-10-CM | POA: Insufficient documentation

## 2015-02-27 DIAGNOSIS — S63649A Sprain of metacarpophalangeal joint of unspecified thumb, initial encounter: Secondary | ICD-10-CM

## 2015-02-27 DIAGNOSIS — J45909 Unspecified asthma, uncomplicated: Secondary | ICD-10-CM | POA: Insufficient documentation

## 2015-02-27 DIAGNOSIS — Y92310 Basketball court as the place of occurrence of the external cause: Secondary | ICD-10-CM | POA: Insufficient documentation

## 2015-02-27 DIAGNOSIS — W2105XA Struck by basketball, initial encounter: Secondary | ICD-10-CM | POA: Insufficient documentation

## 2015-02-27 DIAGNOSIS — S63641A Sprain of metacarpophalangeal joint of right thumb, initial encounter: Secondary | ICD-10-CM | POA: Insufficient documentation

## 2015-02-27 NOTE — ED Notes (Signed)
R thumb pain/swelling since hitting on the rim while playing basketball yesterday.

## 2015-02-28 MED ORDER — NAPROXEN 500 MG PO TABS: 500.0000 mg | ORAL_TABLET | Freq: Two times a day (BID) | ORAL | Status: DC

## 2015-02-28 NOTE — ED Provider Notes (Signed)
CSN: [id number]     Arrival date & time 02/27/15  2258 History   First MD Initiated Contact with Patient 02/27/15 2340     Chief Complaint  Patient presents with  . Finger Injury     (Consider location/radiation/quality/duration/timing/severity/associated sxs/prior Treatment) HPI Comments: R thumb and hand pain after hitting it on basketball rim yesterday. No open wounds.  Pain with ROM of thumb. States was "stuck" in flexed positioned and had to use other hand to straighten thumb, No weakness, numbness, tingling.  The history is provided by the patient.    Past Medical History  Diagnosis Date  . Asthma   . ADHD (attention deficit hyperactivity disorder), predominantly hyperactive impulsive type    History reviewed. No pertinent past surgical history. Family History  Problem Relation Age of Onset  . Hypertension Mother   . Diabetes Other   . Hypertension Other    History  Substance Use Topics  . Smoking status: Never Smoker   . Smokeless tobacco: Not on file  . Alcohol Use: Yes     Comment: sometimes    Review of Systems  Constitutional: Negative for fever, activity change, appetite change and fatigue.  Respiratory: Negative for cough and chest tightness.   Cardiovascular: Negative for chest pain.  Gastrointestinal: Negative for nausea, vomiting and abdominal pain.  Genitourinary: Negative for dysuria and hematuria.  Musculoskeletal: Positive for myalgias, joint swelling and arthralgias.  Skin: Negative for wound.  Neurological: Negative for dizziness, weakness, light-headedness and headaches.      Allergies  Review of patient's allergies indicates no known allergies.  Home Medications   Prior to Admission medications   Medication Sig Start Date End Date Taking? Authorizing Provider  cephALEXin (KEFLEX) 500 MG capsule Take 1 capsule (500 mg total) by mouth 4 (four) times daily. X 10 days Patient not taking: Reported on 01/20/2015 07/30/12   Hayden Rasmussen, NP   diphenhydrAMINE (BENADRYL) 25 MG tablet Take 25 mg by mouth every 6 (six) hours as needed for allergies.    Historical Provider, MD  guaiFENesin-codeine (CHERATUSSIN AC) 100-10 MG/5ML syrup Take 5 mLs by mouth 4 (four) times daily as needed for cough or congestion. 1 to 2 teaspoons every 4 hours for cough and congestion Patient not taking: Reported on 01/20/2015 07/09/12   Hayden Rasmussen, NP  naproxen (NAPROSYN) 500 MG tablet Take 1 tablet (500 mg total) by mouth 2 (two) times daily. 02/28/15   Glynn Octave, MD  ondansetron (ZOFRAN) 8 MG tablet Take 1 tablet (8 mg total) by mouth every 8 (eight) hours as needed for nausea or vomiting. 01/20/15   Mercedes Camprubi-Soms, PA-C  polyethylene glycol powder (MIRALAX) powder Take 17 g by mouth daily. Patient not taking: Reported on 01/20/2015 09/06/14   Reuben Likes, MD   BP 114/61 mmHg  Pulse 48  Temp(Src) 98.3 F (36.8 C) (Oral)  Resp 18  Ht  (1.905 m)  Wt 160 lb (72.576 kg)  BMI 20.00 kg/m2  SpO2 100% Physical Exam  Constitutional: He is oriented to person, place, and time. He appears well-developed and well-nourished. No distress.  HENT:  Head: Normocephalic and atraumatic.  Mouth/Throat: Oropharynx is clear and moist. No oropharyngeal exudate.  Eyes: Conjunctivae and EOM are normal. Pupils are equal, round, and reactive to light.  Neck: Normal range of motion. Neck supple.  No meningismus.  Cardiovascular: Normal rate, regular rhythm, normal heart sounds and intact distal pulses.   No murmur heard. Pulmonary/Chest: Effort normal and breath sounds normal. No respiratory  distress.  Abdominal: Soft. There is no tenderness. There is no rebound and no guarding.  Musculoskeletal: Normal range of motion. He exhibits edema and tenderness.  Mild edema of right first MCP. Thumb flexion, extension, abduction, opposition intact. Intact radial pulse. No appreciable UCL laxity. Flexion intact at IP joint.  Neurological: He is alert and oriented to  person, place, and time. No cranial nerve deficit. He exhibits normal muscle tone. Coordination normal.  No ataxia on finger to nose bilaterally. No pronator drift. 5/5 strength throughout. CN 2-12 intact. Negative Romberg. Equal grip strength. Sensation intact. Gait is normal.   Skin: Skin is warm.  Psychiatric: He has a normal mood and affect. His behavior is normal.  Nursing note and vitals reviewed.   ED Course  Procedures (including critical care time) Labs Review Labs Reviewed - No data to display  Imaging Review Dg Hand Complete Right  02/28/2015   CLINICAL DATA:  RIGHT thumb pain and swelling at first metacarpal phalangeal joint, basketball injury tonight.  EXAM: RIGHT HAND - COMPLETE 3+ VIEW  COMPARISON:  RIGHT thumb radiograph May 02, 2008  FINDINGS: There is no evidence of fracture or dislocation. Old first distal metacarpus avulsion injury with tiny corticated bony fragment. There is no evidence of arthropathy or other focal bone abnormality. Soft tissues are unremarkable.  IMPRESSION: No acute fracture deformity or dislocation.  Old distal first metacarpus avulsion injury.   Electronically Signed   By: Awilda Metro M.D.   On: 02/28/2015 00:27     EKG Interpretation None      MDM   Final diagnoses:  Gamekeeper thumb   Thumb pain after hitting on hard object.  Neurovascularly intact Xray negative. Possible gamekeeper's thumb.  Spica splint, NSAIDs, followup with hand.    Glynn Octave, MD 02/28/15 1017

## 2015-02-28 NOTE — Discharge Instructions (Signed)
Finger Sprain  A finger sprain is a tear in one of the strong, fibrous tissues that connect the bones (ligaments) in your finger. The severity of the sprain depends on how much of the ligament is torn. The tear can be either partial or complete.  CAUSES   Often, sprains are a result of a fall or accident. If you extend your hands to catch an object or to protect yourself, the force of the impact causes the fibers of your ligament to stretch too much. This excess tension causes the fibers of your ligament to tear.  SYMPTOMS   You may have some loss of motion in your finger. Other symptoms include:   Bruising.   Tenderness.   Swelling.  DIAGNOSIS   In order to diagnose finger sprain, your caregiver will physically examine your finger or thumb to determine how torn the ligament is. Your caregiver may also suggest an X-ray exam of your finger to make sure no bones are broken.  TREATMENT   If your ligament is only partially torn, treatment usually involves keeping the finger in a fixed position (immobilization) for a short period. To do this, your caregiver will apply a bandage, cast, or splint to keep your finger from moving until it heals. For a partially torn ligament, the healing process usually takes 2 to 3 weeks.  If your ligament is completely torn, you may need surgery to reconnect the ligament to the bone. After surgery a cast or splint will be applied and will need to stay on your finger or thumb for 4 to 6 weeks while your ligament heals.  HOME CARE INSTRUCTIONS   Keep your injured finger elevated, when possible, to decrease swelling.   To ease pain and swelling, apply ice to your joint twice a day, for 2 to 3 days:   Put ice in a plastic bag.   Place a towel between your skin and the bag.   Leave the ice on for 15 minutes.   Only take over-the-counter or prescription medicine for pain as directed by your caregiver.   Do not wear rings on your injured finger.   Do not leave your finger unprotected  until pain and stiffness go away (usually 3 to 4 weeks).   Do not allow your cast or splint to get wet. Cover your cast or splint with a plastic bag when you shower or bathe. Do not swim.   Your caregiver may suggest special exercises for you to do during your recovery to prevent or limit permanent stiffness.  SEEK IMMEDIATE MEDICAL CARE IF:   Your cast or splint becomes damaged.   Your pain becomes worse rather than better.  MAKE SURE YOU:   Understand these instructions.   Will watch your condition.   Will get help right away if you are not doing well or get worse.  Document Released: 08/18/2004 Document Revised: 10/03/2011 Document Reviewed: 03/14/2011  [employer] Patient Information 2015 [employer]. This information is not intended to replace advice given to you by your health care provider. Make sure you discuss any questions you have with your health care provider.

## 2015-02-28 NOTE — ED Notes (Signed)
Patient states he was playing basketball on Thursday and hit his right thumb on the rim of the basketball net.  Proximal joint swollen, moves thumb without difficulty

## 2015-02-28 NOTE — Progress Notes (Signed)
Orthopedic Tech Progress Note Patient Details:  Walter Gonzalez 02-19-1991 [id number]  Ortho Devices Type of Ortho Device: Thumb velcro splint Ortho Device/Splint Location: RUE Ortho Device/Splint Interventions: Application   Walter Gonzalez 02/28/2015, 12:44 AM

## 2015-02-28 NOTE — ED Notes (Signed)
Discharge instructions and prescription reviewed, voiced understanding. 

## 2015-06-22 ENCOUNTER — Other Ambulatory Visit (HOSPITAL_COMMUNITY)
Admission: RE | Admit: 2015-06-22 | Discharge: 2015-06-22 | Disposition: A | Discharge status: Home / Self Care | Payer: Medicaid Other | Source: Ambulatory Visit | Attending: Family Medicine | Admitting: Family Medicine

## 2015-06-22 ENCOUNTER — Emergency Department (INDEPENDENT_AMBULATORY_CARE_PROVIDER_SITE_OTHER)
Admission: EM | Admit: 2015-06-22 | Discharge: 2015-06-22 | Disposition: A | Discharge status: Home / Self Care | Payer: Self-pay | Source: Home / Self Care

## 2015-06-22 DIAGNOSIS — Z113 Encounter for screening for infections with a predominantly sexual mode of transmission: Secondary | ICD-10-CM | POA: Insufficient documentation

## 2015-06-22 DIAGNOSIS — A64 Unspecified sexually transmitted disease: Secondary | ICD-10-CM

## 2015-06-22 LAB — POCT URINALYSIS DIP (DEVICE)
BILIRUBIN URINE: NEGATIVE
GLUCOSE, UA: NEGATIVE mg/dL
Ketones, ur: NEGATIVE mg/dL
Nitrite: NEGATIVE
Protein, ur: 30 mg/dL — AB
Specific Gravity, Urine: 1.025 (ref 1.005–1.030)
Urobilinogen, UA: 1 mg/dL (ref 0.0–1.0)
pH: 6 (ref 5.0–8.0)

## 2015-06-22 MED ORDER — AZITHROMYCIN 250 MG PO TABS
ORAL_TABLET | ORAL | Status: AC
  Filled 2015-06-22: qty 4

## 2015-06-22 MED ORDER — AZITHROMYCIN 250 MG PO TABS
1000.0000 mg | ORAL_TABLET | Freq: Once | ORAL | Status: AC
  Administered 2015-06-22: 1000 mg via ORAL

## 2015-06-22 MED ORDER — CEFTRIAXONE SODIUM 250 MG IJ SOLR
INTRAMUSCULAR | Status: AC
  Filled 2015-06-22: qty 250

## 2015-06-22 MED ORDER — CEFTRIAXONE SODIUM 250 MG IJ SOLR
250.0000 mg | Freq: Once | INTRAMUSCULAR | Status: AC
  Administered 2015-06-22: 250 mg via INTRAMUSCULAR

## 2015-06-22 NOTE — Discharge Instructions (Signed)
Sexually Transmitted Disease A sexually transmitted disease (STD) is a disease or infection often passed to another person during sex. However, STDs can be passed through nonsexual ways. An STD can be passed through:  Spit (saliva).  Semen.  Blood.  Mucus from the vagina.  Pee (urine). HOW CAN I LESSEN MY CHANCES OF GETTING AN STD?  Use:  Latex condoms.  Water-soluble lubricants with condoms. Do not use petroleum jelly or oils.  Dental dams. These are small pieces of latex that are used as a barrier during oral sex.  Avoid having more than one sex partner.  Do not have sex with someone who has other sex partners.  Do not have sex with anyone you do not know or who is at high risk for an STD.  Avoid risky sex that can break your skin.  Do not have sex if you have open sores on your mouth or skin.  Avoid drinking too much alcohol or taking illegal drugs. Alcohol and drugs can affect your good judgment.  Avoid oral and anal sex acts.  Get shots (vaccines) for HPV and hepatitis.  If you are at risk of being infected with HIV, it is advised that you take a certain medicine daily to prevent HIV infection. This is called pre-exposure prophylaxis (PrEP). You may be at risk if:  You are a man who has sex with other men (MSM).  You are attracted to the opposite sex (heterosexual) and are having sex with more than one partner.  You take drugs with a needle.  You have sex with someone who has HIV.  Talk with your doctor about if you are at high risk of being infected with HIV. If you begin to take PrEP, get tested for HIV first. Get tested every 3 months for as long as you are taking PrEP.  Get tested for STDs every year if you are sexually active. If you are treated for an STD, get tested again 3 months after you are treated. WHAT SHOULD I DO IF I THINK I HAVE AN STD?  See your doctor.  Tell your sex partner(s) that you have an STD. They should be tested and treated.  Do  not have sex until your doctor says it is okay. WHEN SHOULD I GET HELP? Get help right away if:  You have bad belly (abdominal) pain.  You are a man and have puffiness (swelling) or pain in your testicles.  You are a woman and have puffiness in your vagina.   This information is not intended to replace advice given to you by your health care provider. Make sure you discuss any questions you have with your health care provider.   Document Released: 08/18/2004 Document Revised: 08/01/2014 Document Reviewed: 01/04/2013 [employer] Interactive Patient Education 2016 [employer]  

## 2015-06-22 NOTE — ED Provider Notes (Signed)
CSN: [id number]     Arrival date & time 06/22/15  1841 History   None    No chief complaint on file.  (Consider location/radiation/quality/duration/timing/severity/associated sxs/prior Treatment) HPI History obtained from patient:   LOCATION:penis SEVERITY: DURATION:1 week CONTEXT:drinking a lot lately QUALITY: MODIFYING FACTORS:none ASSOCIATED SYMPTOMS:burning TIMING:constant Previous treatment for chlamydia and GC.   Past Medical History  Diagnosis Date  . Asthma   . ADHD (attention deficit hyperactivity disorder), predominantly hyperactive impulsive type    No past surgical history on file. Family History  Problem Relation Age of Onset  . Hypertension Mother   . Diabetes Other   . Hypertension Other    Social History  Substance Use Topics  . Smoking status: Never Smoker   . Smokeless tobacco: Not on file  . Alcohol Use: Yes     Comment: sometimes    Review of Systems ROS +'ve drainage from penis  Denies: HEADACHE, NAUSEA, ABDOMINAL PAIN, CHEST PAIN, CONGESTION, DYSURIA, SHORTNESS OF BREATH  Allergies  Review of patient's allergies indicates no known allergies.  Home Medications   Prior to Admission medications   Medication Sig Start Date End Date Taking? Authorizing Provider  cephALEXin (KEFLEX) 500 MG capsule Take 1 capsule (500 mg total) by mouth 4 (four) times daily. X 10 days Patient not taking: Reported on 01/20/2015 07/30/12   Hayden Rasmussen, NP  diphenhydrAMINE (BENADRYL) 25 MG tablet Take 25 mg by mouth every 6 (six) hours as needed for allergies.    Historical Provider, MD  guaiFENesin-codeine (CHERATUSSIN AC) 100-10 MG/5ML syrup Take 5 mLs by mouth 4 (four) times daily as needed for cough or congestion. 1 to 2 teaspoons every 4 hours for cough and congestion Patient not taking: Reported on 01/20/2015 07/09/12   Hayden Rasmussen, NP  naproxen (NAPROSYN) 500 MG tablet Take 1 tablet (500 mg total) by mouth 2 (two) times daily. 02/28/15   Glynn Octave, MD   ondansetron (ZOFRAN) 8 MG tablet Take 1 tablet (8 mg total) by mouth every 8 (eight) hours as needed for nausea or vomiting. 01/20/15   Mercedes Camprubi-Soms, PA-C  polyethylene glycol powder (MIRALAX) powder Take 17 g by mouth daily. Patient not taking: Reported on 01/20/2015 09/06/14   Reuben Likes, MD   Meds Ordered and Administered this Visit   Medications  cefTRIAXone (ROCEPHIN) injection 250 mg (not administered)  azithromycin (ZITHROMAX) tablet 1,000 mg (not administered)    BP 115/71 mmHg  Pulse 60  Temp(Src) 98.5 F (36.9 C) (Oral)  Resp 16  SpO2 100% No data found.   Physical Exam  Constitutional: He is oriented to person, place, and time. He appears well-developed and well-nourished.  Eyes: Conjunctivae are normal.  Pulmonary/Chest: Effort normal.  Abdominal: Soft.  Genitourinary: Testes normal. Discharge found.  Musculoskeletal: Normal range of motion.  Neurological: He is alert and oriented to person, place, and time.  Skin: Skin is warm and dry.  Psychiatric: He has a normal mood and affect. His behavior is normal. Judgment and thought content normal.  Nursing note and vitals reviewed.   ED Course  Procedures (including critical care time)  Labs Review Labs Reviewed  POCT URINALYSIS DIP (DEVICE) - Abnormal; Notable for the following:    Hgb urine dipstick TRACE (*)    Protein, ur 30 (*)    Leukocytes, UA MODERATE (*)    All other components within normal limits  URINE CYTOLOGY ANCILLARY ONLY    Imaging Review No results found.   Visual Acuity Review  Right Eye Distance:  Left Eye Distance:   Bilateral Distance:    Right Eye Near:   Left Eye Near:    Bilateral Near:         MDM   1. STD (male)    Urine culture for GC/chlamydia  Treated with ceftriaxone and azithromycin     Walter Gonzalez, [place] 06/23/15 [phone]

## 2015-06-23 LAB — URINE CYTOLOGY ANCILLARY ONLY
Chlamydia: POSITIVE — AB
NEISSERIA GONORRHEA: POSITIVE — AB

## 2015-06-23 NOTE — ED Notes (Signed)
Final report of STD testing available for review. Positive for GC and chlamydia. Treatment while in [facility] adequate. Form 2124 [facility]. Unable to contact patient at phone # provided, will attempt at a later time. Address listed is not a valid address

## 2015-06-25 LAB — URINE CULTURE: Special Requests: NORMAL

## 2015-06-25 NOTE — ED Notes (Signed)
Final report of UA culture shows multiple species, usual report for contaminated sample

## 2015-12-11 ENCOUNTER — Ambulatory Visit: Payer: Medicaid Other

## 2015-12-26 ENCOUNTER — Encounter (HOSPITAL_COMMUNITY): Payer: Self-pay | Admitting: Emergency Medicine

## 2015-12-26 ENCOUNTER — Ambulatory Visit (HOSPITAL_COMMUNITY)
Admission: EM | Admit: 2015-12-26 | Discharge: 2015-12-26 | Disposition: A | Discharge status: Home / Self Care | Payer: Medicaid Other | Attending: Family Medicine | Admitting: Family Medicine

## 2015-12-26 DIAGNOSIS — M25462 Effusion, left knee: Secondary | ICD-10-CM

## 2015-12-26 MED ORDER — IBUPROFEN 800 MG PO TABS
ORAL_TABLET | ORAL | Status: AC
  Filled 2015-12-26: qty 1

## 2015-12-26 NOTE — ED Notes (Signed)
[facility] a [facility] of a left knee injury secondary to hitting his knee on a metal counter at work today.

## 2015-12-26 NOTE — ED Provider Notes (Signed)
CSN: [id number]     Arrival date & time 12/26/15  1307 History   First MD Initiated Contact with Patient 12/26/15 1403     Chief Complaint  Patient presents with  . Knee Injury    Patient is a 25 y.o. male presenting with knee pain. The history is provided by the patient.  Knee Pain Location:  Knee Time since incident:  6 hours Injury: yes   Knee location:  L knee Pain details:    Quality:  Throbbing and pressure   Radiates to:  Does not radiate   Severity:  Moderate   Onset quality:  Sudden   Duration:  6 hours   Timing:  Constant   Progression:  Unchanged Chronicity:  New Dislocation: no   Prior injury to area:  No Relieved by:  None tried Worsened by:  Nothing tried Ineffective treatments:  None tried Associated symptoms: numbness and swelling   Associated symptoms: no fever, no muscle weakness and no stiffness   Risk factors: no concern for non-accidental trauma, no frequent fractures, no known bone disorder, no obesity and no recent illness   Struck (L) knee on a metal table very hard this am at work. Is ambulatory on the LLE, concerned about swelling.  Past Medical History  Diagnosis Date  . Asthma   . ADHD (attention deficit hyperactivity disorder), predominantly hyperactive impulsive type    History reviewed. No pertinent past surgical history. Family History  Problem Relation Age of Onset  . Hypertension Mother   . Diabetes Other   . Hypertension Other    Social History  Substance Use Topics  . Smoking status: Never Smoker   . Smokeless tobacco: None  . Alcohol Use: Yes     Comment: sometimes    Review of Systems  Constitutional: Negative for fever.  Musculoskeletal: Negative for stiffness.  All other systems reviewed and are negative.   Allergies  Review of patient's allergies indicates no known allergies.  Home Medications   Prior to Admission medications   Medication Sig Start Date End Date Taking? Authorizing Provider  cephALEXin (KEFLEX)  500 MG capsule Take 1 capsule (500 mg total) by mouth 4 (four) times daily. X 10 days Patient not taking: Reported on 01/20/2015 07/30/12   Hayden Rasmussenavid Mabe, NP  diphenhydrAMINE (BENADRYL) 25 MG tablet Take 25 mg by mouth every 6 (six) hours as needed for allergies.    Historical Provider, MD  guaiFENesin-codeine (CHERATUSSIN AC) 100-10 MG/5ML syrup Take 5 mLs by mouth 4 (four) times daily as needed for cough or congestion. 1 to 2 teaspoons every 4 hours for cough and congestion Patient not taking: Reported on 01/20/2015 07/09/12   Hayden Rasmussenavid Mabe, NP  naproxen (NAPROSYN) 500 MG tablet Take 1 tablet (500 mg total) by mouth 2 (two) times daily. 02/28/15   Glynn OctaveStephen Rancour, MD  ondansetron (ZOFRAN) 8 MG tablet Take 1 tablet (8 mg total) by mouth every 8 (eight) hours as needed for nausea or vomiting. 01/20/15   Mercedes Camprubi-Soms, PA-C  polyethylene glycol powder (MIRALAX) powder Take 17 g by mouth daily. Patient not taking: Reported on 01/20/2015 09/06/14   Reuben Likesavid C Keller, MD   Meds Ordered and Administered this Visit  Medications - No data to display  BP 121/71 mmHg  Pulse 49  Temp(Src) 98.2 F (36.8 C) (Oral)  SpO2 100% No data found.   Physical Exam  Constitutional: He is oriented to person, place, and time. He appears well-developed and well-nourished.  HENT:  Head: Normocephalic.  Eyes:  Conjunctivae are normal.  Cardiovascular: Normal rate.   Pulmonary/Chest: Effort normal.  Musculoskeletal:       Left knee: He exhibits decreased range of motion, swelling, effusion and abnormal patellar mobility. He exhibits no laceration, no erythema, normal alignment and no LCL laxity. Tenderness found.  Neurological: He is alert and oriented to person, place, and time.  Skin: Skin is warm and dry.  Psychiatric: He has a normal mood and affect.    ED Course  Procedures (including critical care time)  Labs Review Labs Reviewed - No data to display  Imaging Review No results found.   Visual Acuity  Review  Right Eye Distance:   Left Eye Distance:   Bilateral Distance:    Right Eye Near:   Left Eye Near:    Bilateral Near:         MDM   1. Knee effusion, left   Trauma to (L) knee this am. Swollen w/ effusion. No erythema, open wound or deformity.  Supportive (L) knee sleeve. Instructions for RICE, Ibuprofen and ortho f/u if needed.   Leanne Chang, NP 12/26/15 1439

## 2015-12-26 NOTE — Discharge Instructions (Signed)
Where knee sleeve as needed for comfort. Ibuprofen 600-800 mg 3 times a day for 3-4 days. See RICE instructions.  Knee Effusion Knee effusion means that you have excess fluid in your knee joint. This can cause pain and swelling in your knee. This may make your knee more difficult to bend and move. That is because there is increased pain and pressure in the joint. If there is fluid in your knee, it often means that something is wrong inside your knee, such as severe arthritis, abnormal inflammation, or an infection. Another common cause of knee effusion is an injury to the knee muscles, ligaments, or cartilage. HOME CARE INSTRUCTIONS  Use crutches as directed by your health care provider.  Wear a knee brace as directed by your health care provider.  Apply ice to the swollen area:  Put ice in a plastic bag.  Place a towel between your skin and the bag.  Leave the ice on for 20 minutes, 2-3 times per day.  Keep your knee raised (elevated) when you are sitting or lying down.  Take medicines only as directed by your health care provider.  Do any rehabilitation or strengthening exercises as directed by your health care provider.  Rest your knee as directed by your health care provider. You may start doing your normal activities again when your health care provider approves.   Keep all follow-up visits as directed by your health care provider. This is important. SEEK MEDICAL CARE IF:  You have ongoing (persistent) pain in your knee. SEEK IMMEDIATE MEDICAL CARE IF:  You have increased swelling or redness of your knee.  You have severe pain in your knee.  You have a fever.   This information is not intended to replace advice given to you by your health care provider. Make sure you discuss any questions you have with your health care provider.   Document Released: 10/01/2003 Document Revised: 08/01/2014 Document Reviewed: 02/24/2014 Elsevier Interactive Patient Education 2016  Elsevier Inc.  [employer] Therapy Heat therapy can help ease sore, stiff, injured, and tight muscles and joints. Heat relaxes your muscles, which may help ease your pain. Heat therapy should only be used on old, pre-existing, or long-lasting (chronic) injuries. Do not use heat therapy unless told by your doctor. HOW TO USE HEAT THERAPY There are several different kinds of heat therapy, including:  Moist heat pack.  Warm water bath.  Hot water bottle.  Electric heating pad.  Heated gel pack.  Heated wrap.  Electric heating pad. GENERAL HEAT THERAPY RECOMMENDATIONS   Do not sleep while using heat therapy. Only use heat therapy while you are awake.  Your skin may turn pink while using heat therapy. Do not use heat therapy if your skin turns red.  Do not use heat therapy if you have new pain.  High heat or long exposure to heat can cause burns. Be careful when using heat therapy to avoid burning your skin.  Do not use heat therapy on areas of your skin that are already irritated, such as with a rash or sunburn. GET HELP IF:   You have blisters, redness, swelling (puffiness), or numbness.  You have new pain.  Your pain is worse. MAKE SURE YOU:  Understand these instructions.  Will watch your condition.  Will get help right away if you are not doing well or get worse.   This information is not intended to replace advice given to you by your health care provider. Make sure you discuss any questions  you have with your health care provider.   Document Released: 10/03/2011 Document Revised: 08/01/2014 Document Reviewed: 09/03/2013 Elsevier Interactive Patient Education [employer] Elsevier Inc.

## 2016-03-20 ENCOUNTER — Ambulatory Visit (HOSPITAL_COMMUNITY)
Admission: EM | Admit: 2016-03-20 | Discharge: 2016-03-20 | Disposition: A | Discharge status: Nursing Home (Includes ICF) | Payer: Medicaid Other | Attending: Surgery | Admitting: Surgery

## 2016-03-20 ENCOUNTER — Emergency Department (HOSPITAL_COMMUNITY)
Admission: EM | Admit: 2016-03-20 | Discharge: 2016-03-20 | Disposition: A | Discharge status: Home / Self Care | Payer: No Typology Code available for payment source | Attending: Dermatology | Admitting: Dermatology

## 2016-03-20 ENCOUNTER — Encounter (HOSPITAL_COMMUNITY): Payer: Self-pay | Admitting: Emergency Medicine

## 2016-03-20 ENCOUNTER — Ambulatory Visit (INDEPENDENT_AMBULATORY_CARE_PROVIDER_SITE_OTHER): Payer: Medicaid Other

## 2016-03-20 DIAGNOSIS — Z5321 Procedure and treatment not carried out due to patient leaving prior to being seen by health care provider: Secondary | ICD-10-CM | POA: Insufficient documentation

## 2016-03-20 DIAGNOSIS — S161XXA Strain of muscle, fascia and tendon at neck level, initial encounter: Secondary | ICD-10-CM

## 2016-03-20 DIAGNOSIS — M79651 Pain in right thigh: Secondary | ICD-10-CM

## 2016-03-20 DIAGNOSIS — M79641 Pain in right hand: Secondary | ICD-10-CM

## 2016-03-20 DIAGNOSIS — X58XXXA Exposure to other specified factors, initial encounter: Secondary | ICD-10-CM | POA: Insufficient documentation

## 2016-03-20 DIAGNOSIS — Y999 Unspecified external cause status: Secondary | ICD-10-CM | POA: Insufficient documentation

## 2016-03-20 DIAGNOSIS — Z041 Encounter for examination and observation following transport accident: Secondary | ICD-10-CM | POA: Insufficient documentation

## 2016-03-20 DIAGNOSIS — Y92410 Unspecified street and highway as the place of occurrence of the external cause: Secondary | ICD-10-CM | POA: Insufficient documentation

## 2016-03-20 DIAGNOSIS — J45909 Unspecified asthma, uncomplicated: Secondary | ICD-10-CM | POA: Insufficient documentation

## 2016-03-20 DIAGNOSIS — F909 Attention-deficit hyperactivity disorder, unspecified type: Secondary | ICD-10-CM | POA: Insufficient documentation

## 2016-03-20 DIAGNOSIS — Y939 Activity, unspecified: Secondary | ICD-10-CM | POA: Insufficient documentation

## 2016-03-20 DIAGNOSIS — F17200 Nicotine dependence, unspecified, uncomplicated: Secondary | ICD-10-CM | POA: Insufficient documentation

## 2016-03-20 DIAGNOSIS — M79604 Pain in right leg: Secondary | ICD-10-CM

## 2016-03-20 DIAGNOSIS — M79659 Pain in unspecified thigh: Secondary | ICD-10-CM
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
## 2016-03-20 NOTE — Discharge Instructions (Signed)
STRICT NON-WEIGHT BEARING RIGHT LEG UNTIL FOLLOW UP WITH ORTHOPEDICS NEXT WEEK.  USE CRUTCHES.  CAN APPLY ICE OFF AND ON TO THE AREAS ADDRESSED TODAY.    IF PAIN WORSENS YOU MUST GO TO THE EMERGENCY ROOM IMMEDIATELY.    AS WE DISCUSSED THERE IS A CONCERN ABOUT A POSSIBLE STRESS FRACTURE IN THE RIGHT PROXIMAL FEMUR AND CT SCAN WAS RECOMMENDED.  IF THERE IS A STRESS FRACTURE THIS COULD LEAD TO A MORE COMPLEX PROBLEM IF NOT TREATED APPROPRIATELY.
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
## 2016-03-20 NOTE — ED Notes (Signed)
[facility] that [facility] left the ED and was not able to wait to get CT. PA notified and order for crutches received and patient given note states he should not return to work until he is seen by ortho. Patient given crutches and d/c instructions reviewed again and importance of follow up discussed.

## 2016-03-22 ENCOUNTER — Emergency Department (HOSPITAL_COMMUNITY): Payer: No Typology Code available for payment source

## 2016-03-22 ENCOUNTER — Encounter (HOSPITAL_COMMUNITY): Payer: Self-pay

## 2016-03-22 ENCOUNTER — Emergency Department (HOSPITAL_COMMUNITY)
Admission: EM | Admit: 2016-03-22 | Discharge: 2016-03-22 | Disposition: A | Discharge status: Home / Self Care | Payer: No Typology Code available for payment source | Attending: Emergency Medicine | Admitting: Emergency Medicine

## 2016-03-22 DIAGNOSIS — F909 Attention-deficit hyperactivity disorder, unspecified type: Secondary | ICD-10-CM | POA: Diagnosis not present

## 2016-03-22 DIAGNOSIS — S8011XD Contusion of right lower leg, subsequent encounter: Secondary | ICD-10-CM | POA: Insufficient documentation

## 2016-03-22 DIAGNOSIS — S6990XD Unspecified injury of unspecified wrist, hand and finger(s), subsequent encounter: Secondary | ICD-10-CM | POA: Diagnosis present

## 2016-03-22 DIAGNOSIS — Z79899 Other long term (current) drug therapy: Secondary | ICD-10-CM | POA: Insufficient documentation

## 2016-03-22 DIAGNOSIS — S63610D Unspecified sprain of right index finger, subsequent encounter: Secondary | ICD-10-CM | POA: Diagnosis not present

## 2016-03-22 DIAGNOSIS — F17200 Nicotine dependence, unspecified, uncomplicated: Secondary | ICD-10-CM | POA: Diagnosis not present

## 2016-03-22 DIAGNOSIS — J45909 Unspecified asthma, uncomplicated: Secondary | ICD-10-CM | POA: Diagnosis not present

## 2016-03-22 MED ORDER — ACETAMINOPHEN 325 MG PO TABS
325.0000 mg | ORAL_TABLET | Freq: Once | ORAL | Status: AC
  Administered 2016-03-22: 325 mg via ORAL
  Filled 2016-03-22: qty 1

## 2016-03-22 MED ORDER — IBUPROFEN 400 MG PO TABS: 400.0000 mg | ORAL_TABLET | Freq: Once | ORAL | Status: DC
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
## 2016-03-22 NOTE — Discharge Instructions (Signed)
Wear knee immobilizer. Use crutches as needed. Use ibuprofen and tylenol for pain. Follow up with ortho in 1-2 weeks. Rest, ice, and elevated knee and hand. Return if symptoms worsen.
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
## 2016-04-02 ENCOUNTER — Emergency Department (HOSPITAL_COMMUNITY)
Admission: EM | Admit: 2016-04-02 | Discharge: 2016-04-02 | Disposition: A | Discharge status: Home / Self Care | Payer: Medicaid Other | Attending: Emergency Medicine | Admitting: Emergency Medicine

## 2016-04-02 ENCOUNTER — Encounter (HOSPITAL_COMMUNITY): Payer: Self-pay

## 2016-04-02 ENCOUNTER — Encounter (HOSPITAL_COMMUNITY): Payer: Self-pay | Admitting: Emergency Medicine

## 2016-04-02 ENCOUNTER — Ambulatory Visit (HOSPITAL_COMMUNITY)
Admission: EM | Admit: 2016-04-02 | Discharge: 2016-04-02 | Disposition: A | Discharge status: Nursing Home (Includes ICF) | Payer: Medicaid Other | Attending: Internal Medicine | Admitting: Internal Medicine

## 2016-04-02 DIAGNOSIS — T7840XA Allergy, unspecified, initial encounter: Secondary | ICD-10-CM

## 2016-04-02 DIAGNOSIS — J45909 Unspecified asthma, uncomplicated: Secondary | ICD-10-CM | POA: Insufficient documentation

## 2016-04-02 DIAGNOSIS — T63461A Toxic effect of venom of wasps, accidental (unintentional), initial encounter: Secondary | ICD-10-CM

## 2016-04-02 DIAGNOSIS — F17200 Nicotine dependence, unspecified, uncomplicated: Secondary | ICD-10-CM | POA: Insufficient documentation

## 2016-04-02 DIAGNOSIS — F909 Attention-deficit hyperactivity disorder, unspecified type: Secondary | ICD-10-CM | POA: Insufficient documentation

## 2016-04-02 MED ORDER — DIPHENHYDRAMINE HCL 50 MG/ML IJ SOLN
INTRAMUSCULAR | Status: AC
  Filled 2016-04-02: qty 1

## 2016-04-02 MED ORDER — EPINEPHRINE HCL 1 MG/ML IJ SOLN
0.3000 mg | Freq: Once | INTRAMUSCULAR | Status: AC
  Administered 2016-04-02: 0.3 mg via INTRAMUSCULAR

## 2016-04-02 MED ORDER — DIPHENHYDRAMINE HCL 50 MG/ML IJ SOLN
50.0000 mg | Freq: Once | INTRAMUSCULAR | Status: AC
  Administered 2016-04-02: 50 mg via INTRAMUSCULAR

## 2016-04-02 MED ORDER — EPINEPHRINE 0.3 MG/0.3ML IJ SOAJ: 0.3000 mg | Freq: Once | INTRAMUSCULAR | 1 refills | Status: AC

## 2016-04-02 MED ORDER — EPINEPHRINE HCL 1 MG/ML IJ SOLN
INTRAMUSCULAR | Status: AC
  Filled 2016-04-02: qty 1

## 2016-04-02 MED ORDER — METHYLPREDNISOLONE SODIUM SUCC 125 MG IJ SOLR
125.0000 mg | Freq: Once | INTRAMUSCULAR | Status: AC
  Administered 2016-04-02: 125 mg via INTRAMUSCULAR
  Filled 2016-04-02: qty 2

## 2016-04-02 NOTE — ED Triage Notes (Signed)
Patient reports wasp sting 15 minutes prior to arrival.  Arrives with generalized hives and itching, complaint of head feeling of head tightness and throat soreness.  Patient has told a staff member that throat feels tight.  Patient is hyperventilating currently.  Adult family member is with patient

## 2016-04-02 NOTE — Discharge Instructions (Signed)
[facility] go [facility] right now for further monitoring for late phase allergic reaction.  Injections of benadryl (50mg  IM) and epinephrine (3mg  sq) were given at the urgent care.

## 2016-04-02 NOTE — ED Triage Notes (Signed)
[facility] called to state pt is coming POV due to reaction to a bee sting. He was reporting to them itching, hives and throat irritation. [facility] administered 0.3mg  of epi IM with improvement. [facility] and only has hives now but mother of patient "wanted to bring him to ER for further monitoring."

## 2016-04-02 NOTE — ED Provider Notes (Signed)
[facility] Provider Note   CSN: [id number] Arrival date & time: 04/02/16  1424     History   Chief Complaint Chief Complaint  Patient presents with  . Insect Bite    HPI Walter Gonzalez is a 25 y.o. male.  HPI   25 year old male presents today with insect bite. Patient reports that he was seen in urgent care after being stung by a wasp approximately one hour prior to my evaluation. He reports he immediately had hives to the localized area on his lower extremity that spread throughout his entire body. Patient reports he seated straight to urgent care. He reports that he was extremely anxious due to the swelling, but denied any intraoral, respiratory, chest pain, abdominal pain. Patient reports he was given epinephrine at the urgent care which resolved his symptoms. Patient reports he continues to have small amount of redness, no significant swelling, and continues to deny any shortness of breath, chest pain, abdominal pain, swelling of the mouth. Patient denies any significant past medical history of the same. She reports he has a history asthma, does not have an inhaler at home as he rarely ever needs it. He describes this as mild.  Past Medical History:  Diagnosis Date  . ADHD (attention deficit hyperactivity disorder), predominantly hyperactive impulsive type   . Asthma     There are no active problems to display for this patient.   History reviewed. No pertinent surgical history.     Home Medications    Prior to Admission medications   Medication Sig Start Date End Date Taking? Authorizing Provider  EPINEPHrine 0.3 mg/0.3 mL IJ SOAJ injection Inject 0.3 mLs (0.3 mg total) into the muscle once. 04/02/16 04/02/16  Eyvonne Mechanic, PA-C  ibuprofen (ADVIL,MOTRIN) 200 MG tablet Take 400-600 mg by mouth every 6 (six) hours as needed for mild pain or moderate pain.     Historical Provider, MD    Family History Family History  Problem Relation Age of Onset  . Hypertension  Mother   . Diabetes Other   . Hypertension Other     Social History Social History  Substance Use Topics  . Smoking status: Current Every Day Smoker  . Smokeless tobacco: Never Used  . Alcohol use Yes     Comment: sometimes     Allergies   Review of patient's allergies indicates no known allergies.   Review of Systems Review of Systems  All other systems reviewed and are negative.   Physical Exam Updated Vital Signs BP 129/84 (BP Location: Left Arm)   Pulse (!) 58   Temp 97.7 F (36.5 C) (Oral)   Resp (!) 1   Ht 6\' 3"  (1.905 m)   Wt 75.3 kg   SpO2 100%   BMI 20.75 kg/m   Physical Exam  Constitutional: He is oriented to person, place, and time. He appears well-developed and well-nourished.  HENT:  Head: Normocephalic and atraumatic.  Eyes: Conjunctivae are normal. Pupils are equal, round, and reactive to light. Right eye exhibits no discharge. Left eye exhibits no discharge. No scleral icterus.  Neck: Normal range of motion. No JVD present. No tracheal deviation present.  Pulmonary/Chest: Effort normal. No stridor.  Neurological: He is alert and oriented to person, place, and time. Coordination normal.  Skin:  Hives noted to the face chest back upper and lower extremities  Psychiatric: He has a normal mood and affect. His behavior is normal. Judgment and thought content normal.  Nursing note and vitals reviewed.  ED Treatments / Results  Labs (all labs ordered are listed, but only abnormal results are displayed) Labs Reviewed - No data to display  EKG  EKG Interpretation None       Radiology No results found.  Procedures Procedures (including critical care time)  Medications Ordered in ED Medications  methylPREDNISolone sodium succinate (SOLU-MEDROL) 125 mg/2 mL injection 125 mg (125 mg Intramuscular Given 04/02/16 1536)     Initial Impression / Assessment and Plan / ED Course  I have reviewed the triage vital signs and the nursing  notes.  Pertinent labs & imaging results that were available during my care of the patient were reviewed by me and considered in my medical decision making (see chart for details).  Clinical Course     Final Clinical Impressions(s) / ED Diagnoses   Final diagnoses:  Wasp sting, accidental or unintentional, initial encounter   Labs:  Imaging:  Consults:  Therapeutics:Solu-Medrol  Discharge Meds: EpiPen  Assessment/Plan: [age] YOM male presents today after being stung by an insect. Patient was given epinephrine at urgent care and followed up here in the emergency room POV. I was asked to evaluate the patient by nursing staff as he was in the waiting room and did not want to stay to be evaluated. Patient continues to have hives, but he denies any signs of true anaphylactic reaction now or upon his initial arrival. Patient reports that he does not want to stay to be monitored. I informed him that due to significant reaction, epinephrine was given to prevent any life-threatening reaction. I explained to him that the epinephrine is only temporary and we would like to monitor him for greater than 4 hours to make sure there was no rebound. He reports that he does not want to stay as he does not feel this is any more significant than a rash to his body. Patient has never had any significant anaphylactic reactions, he does have a history of asthma but notes this is very mild. Patient has clear lung sounds, no intraoral involvement, no other signs of severe anaphylactic reaction. Patient reports significant improvement after epinephrine. Patient understands and verbalized the risks of leaving prior to complete evaluation, he will be given a dose of Solu-Medrol here in the ED, given a prescription for an epinephrine pen. He is instructed to go straight from the emergency room to the pharmacy to have this filled, and was counseled on how to use this. Patient is instructed use epinephrine and return to  emergency room immediately if any new or worsening signs or symptoms present. Patient verbalizes understanding and agreement to today's plan and had no further questions or concerns at time of discharge.      New Prescriptions Discharge Medication List as of 04/02/2016  3:34 PM    START taking these medications   Details  EPINEPHrine 0.3 mg/0.3 mL IJ SOAJ injection Inject 0.3 mLs (0.3 mg total) into the muscle once., Starting Sat 04/02/2016, Print         Eyvonne MechanicJeffrey Maythe Deramo, PA-C 04/02/16 1606    Benjiman CoreNathan Pickering, MD 04/02/16 1622

## 2016-04-02 NOTE — ED Provider Notes (Signed)
[facility]    CSN: [id number] Arrival date & time: 04/02/16  1338  First Provider Contact:  First MD Initiated Contact with Patient 04/02/16 1352        History   Chief Complaint Chief Complaint  Patient presents with  . Allergic Reaction    HPI Walter Gonzalez is a 25 y.o. male. He reports being stung by a wasp on the right lateral lower leg 15 minutes prior to presentation. He had the immediate appearance of a large welt, followed shortly by head to toe hives. He is itchy and reports this sensation of head pressure and tightness. He is complaining of some throat discomfort, but is swallowing. No coughing, no wheezing. Looks agitated. Scratching. No history of prior systemic insect sting reaction  HPI  Past Medical History:  Diagnosis Date  . ADHD (attention deficit hyperactivity disorder), predominantly hyperactive impulsive type   . Asthma     There are no active problems to display for this patient.   History reviewed. No pertinent surgical history.     Home Medications    Prior to Admission medications   Medication Sig Start Date End Date Taking? Authorizing Provider  ibuprofen (ADVIL,MOTRIN) 200 MG tablet Take 400-600 mg by mouth every 6 (six) hours as needed for mild pain or moderate pain.     Historical Provider, MD    Family History Family History  Problem Relation Age of Onset  . Hypertension Mother   . Diabetes Other   . Hypertension Other     Social History Social History  Substance Use Topics  . Smoking status: Current Every Day Smoker  . Smokeless tobacco: Never Used  . Alcohol use Yes     Comment: sometimes     Allergies   Review of patient's allergies indicates no known allergies.   Review of Systems Review of Systems   Physical Exam Triage Vital Signs ED Triage Vitals [04/02/16 1340]  Enc Vitals Group     BP 140/97     Pulse Rate 85     Resp 18     Temp 98.3 F (36.8 C)     Temp Source Oral     SpO2 100 %    Weight      Height      Head Circumference      Peak Flow      Pain Score      Pain Loc      Pain Edu?      Excl. in GC?    No data found.   Updated Vital Signs BP 140/97 (BP Location: Left Arm)   Pulse 85   Temp 98.3 F (36.8 C) (Oral)   Resp 18   SpO2 100%   Physical Exam  Constitutional: He is oriented to person, place, and time. He appears distressed.  Alert, nicely groomed Not able to sit still, scratching  countless tiny wheals from head to toe  HENT:  Head: Atraumatic.  Eyes:  Conjugate gaze, no eye redness/drainage  Neck: Neck supple.  Cardiovascular: Normal rate and regular rhythm.   Pulmonary/Chest: No respiratory distress. He has no wheezes.  Lungs clear, symmetric breath sounds Appears to be mildly hyperventilating  Abdominal: He exhibits no distension.  Musculoskeletal: Normal range of motion.  Neurological: He is alert and oriented to person, place, and time.  Skin: Skin is warm and dry.  No cyanosis Skin from head to toe is studded with numerous tiny wheals, large (3") wheal at sting site R  lateral lower leg  Nursing note and vitals reviewed.    UC Treatments / Results   Procedures Procedures (including critical care time)  Medications Ordered in UC Medications  EPINEPHrine (ADRENALIN) injection 0.3 mg (0.3 mg Intramuscular Given 04/02/16 1350)  diphenhydrAMINE (BENADRYL) injection 50 mg (50 mg Intramuscular Given 04/02/16 1351)     Final Clinical Impressions(s) / UC Diagnoses   Final diagnoses:  Allergic reaction, initial encounter  Given 50 mg IM Benadryl and 0.3 mg subcutaneous epinephrine at the urgent care. Have recommended the patient proceed to the emergency room for further monitoring, in case of late reaction.    Eustace MooreLaura W Murray, MD 04/02/16 [phone]

## 2016-04-02 NOTE — Discharge Instructions (Signed)
Please continue using Benadryl as needed, after leaving the emergency room gross great to the pharmacy and fill your prescription for EpiPen. Please use if you experience any of the concerning signs or symptoms that we discussed. Please return the emergency room immediately.

## 2016-04-02 NOTE — ED Notes (Signed)
Declined W/C at D/C and was escorted to lobby by RN. 

## 2016-04-02 NOTE — ED Triage Notes (Signed)
Pt. Was stung by a bee and developed itching, hives and throat irritation.  Pt. Was given 0.3mg  of EPI with improvement.  Pt. Is in no acute distress airway is patent.  Pt. States, "I feel better."  Pt. Was given benadryl also Pt. Denies any itching.

## 2016-04-06 ENCOUNTER — Encounter (HOSPITAL_COMMUNITY): Payer: Self-pay | Admitting: Emergency Medicine

## 2016-04-06 ENCOUNTER — Ambulatory Visit (HOSPITAL_COMMUNITY)
Admission: EM | Admit: 2016-04-06 | Discharge: 2016-04-06 | Disposition: A | Discharge status: Home / Self Care | Payer: Self-pay | Attending: Family Medicine | Admitting: Family Medicine

## 2016-04-06 DIAGNOSIS — Z202 Contact with and (suspected) exposure to infections with a predominantly sexual mode of transmission: Secondary | ICD-10-CM | POA: Insufficient documentation

## 2016-04-06 DIAGNOSIS — B009 Herpesviral infection, unspecified: Secondary | ICD-10-CM | POA: Insufficient documentation

## 2016-04-06 DIAGNOSIS — J45909 Unspecified asthma, uncomplicated: Secondary | ICD-10-CM | POA: Insufficient documentation

## 2016-04-06 DIAGNOSIS — A64 Unspecified sexually transmitted disease: Secondary | ICD-10-CM

## 2016-04-06 DIAGNOSIS — F901 Attention-deficit hyperactivity disorder, predominantly hyperactive type: Secondary | ICD-10-CM | POA: Insufficient documentation

## 2016-04-06 LAB — POCT URINALYSIS DIP (DEVICE)
Bilirubin Urine: NEGATIVE
Glucose, UA: NEGATIVE mg/dL
HGB URINE DIPSTICK: NEGATIVE
KETONES UR: NEGATIVE mg/dL
Leukocytes, UA: NEGATIVE
Nitrite: NEGATIVE
PH: 7 (ref 5.0–8.0)
PROTEIN: 30 mg/dL — AB
SPECIFIC GRAVITY, URINE: 1.025 (ref 1.005–1.030)
Urobilinogen, UA: 1 mg/dL (ref 0.0–1.0)

## 2016-04-06 MED ORDER — ACYCLOVIR 400 MG PO TABS: 400.0000 mg | ORAL_TABLET | Freq: Three times a day (TID) | ORAL | 0 refills | Status: AC

## 2016-04-06 MED ORDER — CEFTRIAXONE SODIUM 250 MG IJ SOLR
250.0000 mg | Freq: Once | INTRAMUSCULAR | Status: AC
  Administered 2016-04-06: 250 mg via INTRAMUSCULAR

## 2016-04-06 MED ORDER — AZITHROMYCIN 250 MG PO TABS
1000.0000 mg | ORAL_TABLET | Freq: Once | ORAL | Status: AC
  Administered 2016-04-06: 1000 mg via ORAL

## 2016-04-06 MED ORDER — AZITHROMYCIN 250 MG PO TABS
ORAL_TABLET | ORAL | Status: AC
  Filled 2016-04-06: qty 4

## 2016-04-06 MED ORDER — CEFTRIAXONE SODIUM 250 MG IJ SOLR
INTRAMUSCULAR | Status: AC
  Filled 2016-04-06: qty 250

## 2016-04-06 MED ORDER — LIDOCAINE HCL (PF) 1 % IJ SOLN
INTRAMUSCULAR | Status: AC
Start: 2016-04-06 — End: 2016-04-06
  Filled 2016-04-06: qty 2

## 2016-04-06 NOTE — ED Provider Notes (Signed)
[facility]    CSN: [id number] Arrival date & time: 04/06/16  [id number]  First Provider Contact:  First MD Initiated Contact with Patient 04/06/16 1006   History   Chief Complaint Chief Complaint  Patient presents with  . Exposure to STD    HPI Walter Gonzalez is a 25 y.o. male with a history of gonorrhea and chlamydia presenting for burning with urination and STI check.   He was told his single male sexual partner has chlamydia and so we would like STI check. He's had burning with urination and scant penile discharge for the past 3 days, constant since that time without change in frequency or urgency. No abd pain, N/V/D, back pain, or fever. He noticed a rash on the left side of the penis 1 day ago that is tender.   HPI  Past Medical History:  Diagnosis Date  . ADHD (attention deficit hyperactivity disorder), predominantly hyperactive impulsive type   . Asthma    There are no active problems to display for this patient.  History reviewed. No pertinent surgical history.  Home Medications    Prior to Admission medications   Medication Sig Start Date End Date Taking? Authorizing Provider  acyclovir (ZOVIRAX) 400 MG tablet Take 1 tablet (400 mg total) by mouth 3 (three) times daily. 04/06/16 04/13/16  Tyrone Nineyan B Fay Bagg, MD  ibuprofen (ADVIL,MOTRIN) 200 MG tablet Take 400-600 mg by mouth every 6 (six) hours as needed for mild pain or moderate pain.     Historical Provider, MD   Family History Family History  Problem Relation Age of Onset  . Hypertension Mother   . Diabetes Other   . Hypertension Other    Social History Social History  Substance Use Topics  . Smoking status: Current Every Day Smoker  . Smokeless tobacco: Never Used  . Alcohol use Yes     Comment: sometimes   Allergies   Review of patient's allergies indicates no known allergies.  Review of Systems Review of Systems Per HPI  Physical Exam Triage Vital Signs ED Triage Vitals [04/06/16 1008]    Enc Vitals Group     BP 124/82     Pulse Rate 69     Resp 16     Temp 98.7 F (37.1 C)     Temp Source Oral     SpO2 99 %     Weight      Height      Head Circumference      Peak Flow      Pain Score      Pain Loc      Pain Edu?      Excl. in GC?    No data found.   Updated Vital Signs BP 124/82 (BP Location: Right Arm)   Pulse 69   Temp 98.7 F (37.1 C) (Oral)   Resp 16   SpO2 99%    Physical Exam  Constitutional: He is oriented to person, place, and time. He appears well-developed and well-nourished. No distress.  HENT:  Mouth/Throat: Oropharynx is clear and moist. No oropharyngeal exudate.  Eyes: Conjunctivae are normal. No scleral icterus.  Neck: Neck supple.  Cardiovascular: Regular rhythm.   Pulmonary/Chest: Effort normal and breath sounds normal.  Abdominal: Soft. Bowel sounds are normal. He exhibits no distension. There is no tenderness.  Genitourinary:  Genitourinary Comments: Tightly grouped vesicles on left distal penis proximal to corona without drainage, erythema or ulceration. Testes normal. +shotty inguinal lymphadenopathy.   Musculoskeletal: He exhibits  no deformity.  Lymphadenopathy:    He has no cervical adenopathy.  Neurological: He is alert and oriented to person, place, and time.  Skin: Skin is warm and dry. Capillary refill takes less than 2 seconds. He is not diaphoretic.     UC Treatments / Results  Labs (all labs ordered are listed, but only abnormal results are displayed) Labs Reviewed  POCT URINALYSIS DIP (DEVICE) - Abnormal; Notable for the following:       Result Value   Protein, ur 30 (*)    All other components within normal limits  HSV CULTURE AND TYPING  URINALYSIS, ROUTINE W REFLEX MICROSCOPIC (NOT AT [facility])  HIV ANTIBODY (ROUTINE TESTING)  RPR  URINE CYTOLOGY ANCILLARY ONLY   EKG  EKG Interpretation None      Radiology No results found.  Procedures Procedures (including critical care time)  Medications  Ordered in UC Medications  azithromycin (ZITHROMAX) tablet 1,000 mg (not administered)  cefTRIAXone (ROCEPHIN) injection 250 mg (not administered)   Initial Impression / Assessment and Plan / UC Course  I have reviewed the triage vital signs and the nursing notes.  Pertinent labs & imaging results that were available during my care of the patient were reviewed by me and considered in my medical decision making (see chart for details).  Final Clinical Impressions(s) / UC Diagnoses   Final diagnoses:  Sexually transmitted infection  Herpes infection   25 y.o. 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  - CTX 250mg  IM x1, azithromycin 1g po x1 here - Rx acyclovir for primary outbreak  - Check urine for GC/Chl and UA - Last HIV/RPR checked > 1 year ago, will recheck this  New Prescriptions New Prescriptions   ACYCLOVIR (ZOVIRAX) 400 MG TABLET    Take 1 tablet (400 mg total) by mouth 3 (three) times daily.     Tyrone Nine, MD 04/06/16 1049

## 2016-04-06 NOTE — ED Notes (Signed)
Labs done.

## 2016-04-06 NOTE — ED Triage Notes (Signed)
Patient reports his girlfriend has notified him that she has tested positive for chlamydia.  Patient denies any penile discharge.  Patient has noticed bumps on penis this morning.   Patient is alone in treatment room

## 2016-04-06 NOTE — Discharge Instructions (Signed)
You were checked and treated for gonorrhea and chlamydia. We will call you with the results of these tests and of the blood tests for HIV and syphilis.   You were treated for gonorrhea and chlamydia. Take acyclovir three times daily for 7 days to treat the rash which looks like herpes. We will notify you of the results of the test for herpes.  If you develop fever or recurrence of symptoms please return for care.  Always practice safe sex with condoms.

## 2016-04-07 LAB — URINE CYTOLOGY ANCILLARY ONLY
CHLAMYDIA, DNA PROBE: NEGATIVE
NEISSERIA GONORRHEA: NEGATIVE

## 2016-04-07 LAB — RPR: RPR: NONREACTIVE

## 2016-04-07 LAB — HIV ANTIBODY (ROUTINE TESTING W REFLEX): HIV SCREEN 4TH GENERATION: NONREACTIVE

## 2016-04-09 LAB — HSV CULTURE AND TYPING

## 2016-04-18 ENCOUNTER — Telehealth (HOSPITAL_COMMUNITY): Payer: Self-pay | Admitting: Emergency Medicine

## 2016-04-18 NOTE — Telephone Encounter (Signed)
-----   Message from Eustace MooreLaura W Murray, MD sent at 04/09/2016  9:37 PM EDT ----- Please let patient know that test for herpes virus was positive.  [facility]  Recheck for further evaluation if sores persist.  LM

## 2016-04-18 NOTE — Telephone Encounter (Signed)
Called number on file... No answer... No VM Need to see how pt is doing and to give lab results from recent visit on 9/13 Also let pt know labs can be obtained from MyChart

## 2016-05-04 NOTE — Telephone Encounter (Signed)
Called [phone], no answer, no VM Need to see how pt is doing and to give lab results from recent visit on 9/13 Also let pt know labs can be obtained from MyChart

## 2016-06-09 ENCOUNTER — Encounter (HOSPITAL_COMMUNITY): Payer: Self-pay | Admitting: Emergency Medicine

## 2016-06-09 ENCOUNTER — Ambulatory Visit (HOSPITAL_COMMUNITY)
Admission: EM | Admit: 2016-06-09 | Discharge: 2016-06-09 | Disposition: A | Discharge status: Home / Self Care | Payer: Medicaid Other | Attending: Emergency Medicine | Admitting: Emergency Medicine

## 2016-06-09 DIAGNOSIS — M609 Myositis, unspecified: Secondary | ICD-10-CM

## 2016-06-09 DIAGNOSIS — S161XXA Strain of muscle, fascia and tendon at neck level, initial encounter: Secondary | ICD-10-CM

## 2016-06-09 DIAGNOSIS — M542 Cervicalgia: Secondary | ICD-10-CM

## 2016-06-09 MED ORDER — DICLOFENAC SODIUM 1 % TD GEL: 1.0000 "application " | Freq: Four times a day (QID) | TRANSDERMAL | 0 refills | Status: DC

## 2016-06-09 MED ORDER — NAPROXEN 375 MG PO TABS: 375.0000 mg | ORAL_TABLET | Freq: Two times a day (BID) | ORAL | 0 refills | Status: DC

## 2016-06-09 NOTE — ED Triage Notes (Signed)
Pt has been experiencing neck pain and mid upper back pain for about three weeks.  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  He states the neck "locked up" on him about three weeks ago. He states the pain is better on the weekends when he is not working and he has been taking 1,000 mg of Tylenol 4 times a day for the last week.  He gets some relief from the pain with Tylenol.

## 2016-06-09 NOTE — ED Provider Notes (Signed)
CSN: [id number]     Arrival date & time 06/09/16  1003 History   First MD Initiated Contact with Patient 06/09/16 1108     Chief Complaint  Patient presents with  . Neck Pain   (Consider location/radiation/quality/duration/timing/severity/associated sxs/prior Treatment) 25 year old male is complaining of neck muscle pain for the past several weeks. 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 This causes sore aching muscles around the neck and lesser to the trapezii muscles. He denies injury or other trauma to the neck. Gradual onset. Worse with performing as job duties and somewhat improved when not working. Denies focal paresthesias or weakness.      Past Medical History:  Diagnosis Date  . ADHD (attention deficit hyperactivity disorder), predominantly hyperactive impulsive type   . Asthma    History reviewed. No pertinent surgical history. Family History  Problem Relation Age of Onset  . Hypertension Mother   . Diabetes Other   . Hypertension Other    Social History  Substance Use Topics  . Smoking status: Current Every Day Smoker  . Smokeless tobacco: Never Used  . Alcohol use Yes     Comment: sometimes    Review of Systems  Constitutional: Positive for activity change. Negative for fever.  HENT: Negative.   Eyes: Negative.   Respiratory: Negative.   Gastrointestinal: Negative.   Musculoskeletal: Positive for myalgias and neck pain.  Skin: Negative.   Neurological: Negative.   All other systems reviewed and are negative.   Allergies  Patient has no known allergies.  Home Medications   Prior to Admission medications   Medication Sig Start Date End Date Taking? Authorizing Provider  acetaminophen (TYLENOL) 500 MG tablet Take 1,000 mg by mouth every 6 (six) hours as needed for moderate pain.   Yes Historical Provider, MD  diclofenac sodium (VOLTAREN) 1 % GEL Apply 1 application topically 4  (four) times daily. 06/09/16   Hayden Rasmussenavid Mercia Dowe, NP  ibuprofen (ADVIL,MOTRIN) 200 MG tablet Take 400-600 mg by mouth every 6 (six) hours as needed for mild pain or moderate pain.     Historical Provider, MD  naproxen (NAPROSYN) 375 MG tablet Take 1 tablet (375 mg total) by mouth 2 (two) times daily. 06/09/16   Hayden Rasmussenavid Shaketa Serafin, NP   Meds Ordered and Administered this Visit  Medications - No data to display  BP 128/90 (BP Location: Left Arm)   Pulse 102   Temp 98.2 F (36.8 C) (Oral)   SpO2 97%  No data found.   Physical Exam  Constitutional: He is oriented to person, place, and time. He appears well-developed and well-nourished. No distress.  HENT:  Head: Normocephalic and atraumatic.  Neck: Normal range of motion. Neck supple.  Tenderness to the paracervical musculature primarily in the posterior aspect. Patient able to perform full range of motion of the neck but with some soreness of the involved muscles. These include the splenius capiti muscles  and trapezii muscles primarily.  No cervical tenderness, deformity, step-off deformity or discoloration or swelling.  Cardiovascular: Normal rate.   Pulmonary/Chest: Effort normal.  Abdominal: Soft.  Musculoskeletal: Normal range of motion. He exhibits no edema or deformity.  Lymphadenopathy:    He has no cervical adenopathy.  Neurological: He is alert and oriented to person, place, and time. No cranial nerve deficit. Coordination normal.  Skin: Skin is warm and dry.  Psychiatric: He has a normal mood and affect.  Nursing note and vitals  reviewed.   Urgent Care Course   Clinical Course     Procedures (including critical care time)  Labs Review Labs Reviewed - No data to display  Imaging Review No results found.   Visual Acuity Review  Right Eye Distance:   Left Eye Distance:   Bilateral Distance:    Right Eye Near:   Left Eye Near:    Bilateral Near:         MDM   1. Neck pain   2. Acute strain of neck muscle,  initial encounter   3. Myofasciitis    Perform stretches frequently, heat, medication, limit activity that makes pain worse as much as possible. Meds ordered this encounter  Medications  . acetaminophen (TYLENOL) 500 MG tablet    Sig: Take 1,000 mg by mouth every 6 (six) hours as needed for moderate pain.  . naproxen (NAPROSYN) 375 MG tablet    Sig: Take 1 tablet (375 mg total) by mouth 2 (two) times daily.    Dispense:  20 tablet    Refill:  0    Order Specific Question:   Supervising Provider    Answer:   Charm RingsHONIG, ERIN J [id number]]  . diclofenac sodium (VOLTAREN) 1 % GEL    Sig: Apply 1 application topically 4 (four) times daily.    Dispense:  100 g    Refill:  0    Order Specific Question:   Supervising Provider    Answer:   Micheline ChapmanHONIG, ERIN J [[id number]]       Hayden Rasmussenavid Khaleed Holan, NP 06/09/16 2117

## 2016-06-09 NOTE — Discharge Instructions (Addendum)
Perform stretches frequently, heat, medication, limit activity that makes pain worse as much as possible.

## 2017-01-08 ENCOUNTER — Ambulatory Visit (INDEPENDENT_AMBULATORY_CARE_PROVIDER_SITE_OTHER): Payer: Self-pay

## 2017-01-08 ENCOUNTER — Ambulatory Visit (HOSPITAL_COMMUNITY)
Admission: EM | Admit: 2017-01-08 | Discharge: 2017-01-08 | Disposition: A | Discharge status: Home / Self Care | Payer: Self-pay | Attending: Internal Medicine | Admitting: Internal Medicine

## 2017-01-08 ENCOUNTER — Encounter (HOSPITAL_COMMUNITY): Payer: Self-pay | Admitting: Emergency Medicine

## 2017-01-08 DIAGNOSIS — R1084 Generalized abdominal pain: Secondary | ICD-10-CM

## 2017-01-08 DIAGNOSIS — R141 Gas pain: Secondary | ICD-10-CM

## 2017-01-08 DIAGNOSIS — K5901 Slow transit constipation: Secondary | ICD-10-CM

## 2017-01-08 NOTE — ED Provider Notes (Signed)
CSN: [id number]     Arrival date & time 01/08/17  1255 History   None    Chief Complaint  Patient presents with  . Abdominal Pain   (Consider location/radiation/quality/duration/timing/severity/associated sxs/prior Treatment) 26 year old male complaining of abdominal pain for one month. He states it is primarily in the morning but it can occur any time. It does last all day but it does occur every day. Seems to occur mostly in the morning. It is worse after eating he describes pain across the bilateral abdomen-sharp. He has had nausea and a few bouts of low volume diarrhea but no vomiting. Also states his bowel movements are painful. No bleeding. After eating the pain is primarily in the epigastrium.      Past Medical History:  Diagnosis Date  . ADHD (attention deficit hyperactivity disorder), predominantly hyperactive impulsive type   . Asthma    History reviewed. No pertinent surgical history. Family History  Problem Relation Age of Onset  . Hypertension Mother   . Diabetes Other   . Hypertension Other    Social History  Substance Use Topics  . Smoking status: Current Every Day Smoker  . Smokeless tobacco: Never Used  . Alcohol use Yes     Comment: sometimes    Review of Systems  Constitutional: Positive for activity change. Negative for chills, fatigue and fever.  HENT: Negative.   Respiratory: Negative.   Cardiovascular: Negative.   Gastrointestinal: Positive for abdominal pain, diarrhea and nausea. Negative for abdominal distention, blood in stool and vomiting.       States he does have a stool every one to 2 days.  Genitourinary: Negative.   Neurological: Negative for dizziness, syncope and light-headedness.  All other systems reviewed and are negative.   Allergies  Patient has no known allergies.  Home Medications   Prior to Admission medications   Not on File   Meds Ordered and Administered this Visit  Medications - No data to display  BP 117/81 (BP  Location: Right Arm)   Pulse (!) 56   Temp 98.3 F (36.8 C) (Oral)   Resp 16   SpO2 100%  No data found.   Physical Exam  Constitutional: He is oriented to person, place, and time. He appears well-developed and well-nourished. No distress.  Neck: Neck supple.  Cardiovascular: Normal rate, regular rhythm and normal heart sounds.   Pulmonary/Chest: Effort normal and breath sounds normal.  Abdominal: Soft. He exhibits no distension and no mass. No hernia.  Abdomen flat, muscular. Bowel sounds are hypoactive otherwise normal. Generalized tenderness. No rebound or guarding. No peritoneal signs.  Neurological: He is alert and oriented to person, place, and time.  Skin: Skin is warm and dry.  Psychiatric: He has a normal mood and affect.  Nursing note and vitals reviewed.   Urgent Care Course     Procedures (including critical care time)  Labs Review Labs Reviewed - No data to display  Imaging Review Dg Abd 2 Views  Result Date: 01/08/2017 CLINICAL DATA:  Diffuse abdominal pain and generalized tenderness for 1 month. EXAM: ABDOMEN - 2 VIEW COMPARISON:  None. FINDINGS: No dilated small bowel loops or air-fluid levels. Mild to moderate colorectal stool volume. No evidence of pneumatosis or pneumoperitoneum. No radiopaque urolithiasis. Clear lung bases. IMPRESSION: Nonobstructive bowel gas pattern. Electronically Signed   By: Delbert PhenixJason A Poff M.D.   On: 01/08/2017 15:47     Visual Acuity Review  Right Eye Distance:   Left Eye Distance:   Bilateral Distance:  Right Eye Near:   Left Eye Near:    Bilateral Near:         MDM   1. Generalized abdominal pain   2. Slow transit constipation   3. Abdominal gas pain    Most likely the reason for the abdominal pain that you are having is due to increased gas and stool. I do not believe that we have a bowel movement you are emptying enough on a daily basis. Increase her fluid intake and your fiber intake. For now drink 2 glasses  about 6 ounces each with a capful of MiraLAX in each one as a laxative. If in 6 hours she did not have good results of repeat. He may have to use this periodically. For worsening, new symptoms or problems or not getting any better we will need to follow-up with primary care doctor or see a gastroenterologist.    Hayden Rasmussen, NP 01/08/17 1639

## 2017-01-08 NOTE — ED Triage Notes (Signed)
[facility] a [facility] abdominal pain and nausea x 1 month. The patient reported that the symptoms occur when he first wakes in the mornings.

## 2017-01-08 NOTE — Discharge Instructions (Signed)
Most likely the reason for the abdominal pain that you are having is due to increased gas and stool. I do not believe that we have a bowel movement you are emptying enough on a daily basis. Increase her fluid intake and your fiber intake. For now drink 2 glasses about 6 ounces each with a capful of MiraLAX in each one as a laxative. If in 6 hours she did not have good results of repeat. He may have to use this periodically. For worsening, new symptoms or problems or not getting any better we will need to follow-up with primary care doctor or see a gastroenterologist.

## 2017-04-03 ENCOUNTER — Emergency Department (HOSPITAL_COMMUNITY)
Admission: EM | Admit: 2017-04-03 | Discharge: 2017-04-04 | Disposition: A | Discharge status: Home / Self Care | Payer: No Typology Code available for payment source | Attending: Emergency Medicine | Admitting: Emergency Medicine

## 2017-04-03 ENCOUNTER — Encounter (HOSPITAL_COMMUNITY): Payer: Self-pay | Admitting: Nurse Practitioner

## 2017-04-03 ENCOUNTER — Emergency Department (HOSPITAL_COMMUNITY): Payer: No Typology Code available for payment source

## 2017-04-03 DIAGNOSIS — R0789 Other chest pain: Secondary | ICD-10-CM | POA: Insufficient documentation

## 2017-04-03 DIAGNOSIS — Z87891 Personal history of nicotine dependence: Secondary | ICD-10-CM | POA: Insufficient documentation

## 2017-04-03 DIAGNOSIS — J45909 Unspecified asthma, uncomplicated: Secondary | ICD-10-CM | POA: Insufficient documentation

## 2017-04-03 LAB — BASIC METABOLIC PANEL
Anion gap: 8 (ref 5–15)
BUN: 13 mg/dL (ref 6–20)
CALCIUM: 9.4 mg/dL (ref 8.9–10.3)
CO2: 26 mmol/L (ref 22–32)
CREATININE: 0.99 mg/dL (ref 0.61–1.24)
Chloride: 105 mmol/L (ref 101–111)
GFR calc Af Amer: >60 mL/min (ref >60)
GFR calc non Af Amer: >60 mL/min (ref >60)
GLUCOSE: 90 mg/dL (ref 65–99)
Potassium: 4 mmol/L (ref 3.5–5.1)
Sodium: 139 mmol/L (ref 135–145)

## 2017-04-03 LAB — CBC
HCT: 39.2 % (ref 39.0–52.0)
HEMOGLOBIN: 13 g/dL (ref 13.0–17.0)
MCH: 23.7 pg — AB (ref 26.0–34.0)
MCHC: 33.2 g/dL (ref 30.0–36.0)
MCV: 71.5 fL — ABNORMAL LOW (ref 78.0–100.0)
PLATELETS: 379 10*3/uL (ref 150–400)
RBC: 5.48 MIL/uL (ref 4.22–5.81)
RDW: 14.9 % (ref 11.5–15.5)
WBC: 4.5 10*3/uL (ref 4.0–10.5)

## 2017-04-03 LAB — I-STAT TROPONIN, ED: Troponin i, poc: 0 ng/mL (ref 0.00–0.08)

## 2017-04-03 NOTE — ED Triage Notes (Signed)
Pt presents with c/o chest pain. The pain is a sharp pain in the left side of his chest that has been intermittent over the past month. The pain became severe this afternoon while he was at rest. He reports shortness of breath, cough. He denies fevers, heavy exertion or injuries.

## 2017-04-04 MED ORDER — NAPROXEN 250 MG PO TABS
500.0000 mg | ORAL_TABLET | Freq: Once | ORAL | Status: AC
  Administered 2017-04-04: 500 mg via ORAL
  Filled 2017-04-04: qty 2

## 2017-04-04 MED ORDER — NAPROXEN 500 MG PO TABS: 500.0000 mg | ORAL_TABLET | Freq: Two times a day (BID) | ORAL | 0 refills | Status: DC

## 2017-04-04 NOTE — ED Provider Notes (Signed)
[facility]   CSN: [id number] Arrival date & time: 04/03/17  1704     History   Chief Complaint Chief Complaint  Patient presents with  . Chest Pain    HPI Walter Gonzalez is a 26 y.o. male.  HPI  This is a 26 year old male with a history of ADHD and asthma who presents for chest pain. Patient reports 2 month history of pressure-like chest pain with worsening pain over the last 2 days. He states over the last 2 days he has developed sharp left-sided chest pain that is worsened. He states that there is one spot that hurts to press. He has not taken anything for the pain. Pain currently is 7 out of 10. He does not do any heavy lifting and denies any injury but does report "pushing and pulling things" at work. He denies any fevers, shortness of breath, cough. Denies any leg swelling, recent travel, recent hospitalization.  Past Medical History:  Diagnosis Date  . ADHD (attention deficit hyperactivity disorder), predominantly hyperactive impulsive type   . Asthma     There are no active problems to display for this patient.   History reviewed. No pertinent surgical history.     Home Medications    Prior to Admission medications   Medication Sig Start Date End Date Taking? Authorizing Provider  naproxen (NAPROSYN) 500 MG tablet Take 1 tablet (500 mg total) by mouth 2 (two) times daily. 04/04/17   Antolin Belsito, Mayer Maskerourtney F, MD    Family History Family History  Problem Relation Age of Onset  . Hypertension Mother   . Diabetes Other   . Hypertension Other     Social History Social History  Substance Use Topics  . Smoking status: Former Games developermoker  . Smokeless tobacco: Never Used  . Alcohol use Yes     Comment: sometimes     Allergies   Patient has no known allergies.   Review of Systems Review of Systems  Constitutional: Negative for fever.  Respiratory: Negative for cough and shortness of breath.   Cardiovascular: Positive for chest pain.  Negative for leg swelling.  Gastrointestinal: Negative for abdominal pain, nausea and vomiting.  Genitourinary: Negative for dysuria.  All other systems reviewed and are negative.    Physical Exam Updated Vital Signs BP 117/81 (BP Location: Left Arm)   Pulse (!) 55   Temp 98.5 F (36.9 C) (Oral)   Resp 14   Ht 6\' 4"  (1.93 m)   Wt 72.6 kg (160 lb)   SpO2 100%   BMI 19.48 kg/m   Physical Exam  Constitutional: He is oriented to person, place, and time. He appears well-developed and well-nourished. No distress.  HENT:  Head: Normocephalic and atraumatic.  Cardiovascular: Normal rate, regular rhythm and normal heart sounds.   No murmur heard. Pulmonary/Chest: Effort normal and breath sounds normal. No respiratory distress. He has no wheezes. He exhibits tenderness.  Point tenderness to palpation over the left chest just medial to the nipple, no overlying skin changes, no crepitus  Abdominal: Soft. There is no tenderness.  Musculoskeletal: He exhibits no edema.  Neurological: He is alert and oriented to person, place, and time.  Skin: Skin is warm and dry.  Psychiatric: He has a normal mood and affect.  Nursing note and vitals reviewed.    ED Treatments / Results  Labs (all labs ordered are listed, but only abnormal results are displayed) Labs Reviewed  CBC - Abnormal; Notable for the following:  Result Value   MCV 71.5 (*)    MCH 23.7 (*)    All other components within normal limits  BASIC METABOLIC PANEL  I-STAT TROPONIN, ED    EKG  EKG Interpretation  Date/Time:  Monday April 03 2017 17:12:12 EDT Ventricular Rate:  50 PR Interval:  144 QRS Duration: 88 QT Interval:  394 QTC Calculation: 359 R Axis:   85 Text Interpretation:  Sinus bradycardia ?PR depression Confirmed by Ross Marcus ([id number]) on 04/03/2017 11:45:08 PM       Radiology Dg Chest 2 View  Result Date: 04/03/2017 CLINICAL DATA:  Chest pain on LEFT side under breast intermittently  over past month, severe this afternoon, shortness of breath EXAM: CHEST  2 VIEW COMPARISON:  None FINDINGS: Normal heart size, mediastinal contours, and pulmonary vascularity. Lungs mildly hyperaerated but clear. No pulmonary infiltrate, pleural effusion or pneumothorax. Bones unremarkable. IMPRESSION: Mildly hyperaerated lungs without acute infiltrate. Electronically Signed   By: Ulyses Southward M.D.   On: 04/03/2017 17:46    Procedures Procedures (including critical care time)  Medications Ordered in ED Medications  naproxen (NAPROSYN) tablet 500 mg (not administered)     Initial Impression / Assessment and Plan / ED Course  I have reviewed the triage vital signs and the nursing notes.  Pertinent labs & imaging results that were available during my care of the patient were reviewed by me and considered in my medical decision making (see chart for details).     Patient presents for chest pain. Reproducible on exam. Low risk for ACS with a reassuring EKG and negative troponin. Patient is PERC neg.  Recommend anti-inflammatory medications for musculoskeletal chest wall pain.  After history, exam, and medical workup I feel the patient has been appropriately medically screened and is safe for discharge home. Pertinent diagnoses were discussed with the patient. Patient was given return precautions.   Final Clinical Impressions(s) / ED Diagnoses   Final diagnoses:  Chest wall pain    New Prescriptions New Prescriptions   NAPROXEN (NAPROSYN) 500 MG TABLET    Take 1 tablet (500 mg total) by mouth 2 (two) times daily.     Shon Baton, MD 04/04/17 [phone]

## 2017-04-04 NOTE — ED Notes (Signed)
Pt stable, ambulatory, states understanding of discharge instructions 

## 2017-11-15 ENCOUNTER — Ambulatory Visit (HOSPITAL_COMMUNITY)
Admission: EM | Admit: 2017-11-15 | Discharge: 2017-11-15 | Disposition: A | Discharge status: Home / Self Care | Payer: Self-pay | Attending: Family Medicine | Admitting: Family Medicine

## 2017-11-15 ENCOUNTER — Encounter (HOSPITAL_COMMUNITY): Payer: Self-pay | Admitting: Emergency Medicine

## 2017-11-15 ENCOUNTER — Other Ambulatory Visit: Payer: Self-pay

## 2017-11-15 DIAGNOSIS — M545 Low back pain: Secondary | ICD-10-CM

## 2017-11-15 DIAGNOSIS — M62830 Muscle spasm of back: Secondary | ICD-10-CM

## 2017-11-15 MED ORDER — PREDNISONE 10 MG (21) PO TBPK: ORAL_TABLET | Freq: Every day | ORAL | 0 refills | Status: DC

## 2017-11-15 MED ORDER — CYCLOBENZAPRINE HCL 10 MG PO TABS: 10.0000 mg | ORAL_TABLET | Freq: Three times a day (TID) | ORAL | 0 refills | Status: DC

## 2017-11-15 NOTE — ED Triage Notes (Signed)
Chronic back pain.  Patient went to a chiropractor 2 days ago.  Continues to have pain, no radiation into leg

## 2017-11-15 NOTE — Discharge Instructions (Signed)

## 2017-11-21 ENCOUNTER — Ambulatory Visit (HOSPITAL_COMMUNITY)
Admission: EM | Admit: 2017-11-21 | Discharge: 2017-11-21 | Disposition: A | Discharge status: Home / Self Care | Payer: Self-pay | Attending: Family Medicine | Admitting: Family Medicine

## 2017-11-21 ENCOUNTER — Encounter (HOSPITAL_COMMUNITY): Payer: Self-pay | Admitting: *Deleted

## 2017-11-21 DIAGNOSIS — K2970 Gastritis, unspecified, without bleeding: Secondary | ICD-10-CM

## 2017-11-21 DIAGNOSIS — M545 Low back pain, unspecified: Secondary | ICD-10-CM

## 2017-11-21 DIAGNOSIS — K2990 Gastroduodenitis, unspecified, without bleeding: Secondary | ICD-10-CM

## 2017-11-21 DIAGNOSIS — G8929 Other chronic pain: Secondary | ICD-10-CM

## 2017-11-21 MED ORDER — OMEPRAZOLE 20 MG PO CPDR: 20.0000 mg | DELAYED_RELEASE_CAPSULE | Freq: Every day | ORAL | 0 refills | Status: AC

## 2017-11-21 MED ORDER — CYCLOBENZAPRINE HCL 5 MG PO TABS: 5.0000 mg | ORAL_TABLET | Freq: Every day | ORAL | 0 refills | Status: AC

## 2017-11-21 NOTE — ED Provider Notes (Signed)
[facility]ARE [facility]ENTER   [id number] 11/21/17 Arrival Time: 1850   SUBJECTIVE:  Walter Gonzalez is a 27 y.o. male who presents to the urgent care with complaint of lumbar back pain.  This problem has been going on for more than a year.  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  He denies fever, urinary symptoms, leg pain, leg weakness, numbness in the legs, or specific trauma that would have resulted in back pain.  Patient admits to using marijuana.  He says he does have crampy abdominal pain with gas, usually after going to bed.   Patient states that he was seen on the 24th for back pain. Patient states that he needs doctors note and back brace. States that he went to chiropractor. States pain is in upper back. Patient states that he was supposed to go today to chiropractor but over slept his appt. 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   Patient has not filled the prescription for prednisone that he was rx'd on 4/24     Past Medical History:  Diagnosis Date  . ADHD (attention deficit hyperactivity disorder), predominantly hyperactive impulsive type   . Asthma    Family History  Problem Relation Age of Onset  . Hypertension Mother   . Diabetes Other   . Hypertension Other    Social History   Socioeconomic History  . Marital status: Single    Spouse name: Not on file  . Number of children: Not on file  . Years of education: Not on file  . Highest education level: Not on file  Occupational History  . Not on file  Social Needs  . Financial resource strain: Not on file  . Food insecurity:    Worry: Not on file    Inability: Not on file  . Transportation needs:    Medical: Not on file    Non-medical: Not on file  Tobacco Use  . Smoking status: Former Games developer  . Smokeless tobacco: Never Used  Substance and Sexual Activity  . Alcohol use: Yes    Comment: sometimes    . Drug use: Yes    Frequency: 7.0 times per week    Types: Marijuana  . Sexual activity: Not on file  Lifestyle  . Physical activity:    Days per week: Not on file    Minutes per session: Not on file  . Stress: Not on file  Relationships  . Social connections:    Talks on phone: Not on file    Gets together: Not on file    Attends religious service: Not on file    Active member of club or organization: Not on file    Attends meetings of clubs or organizations: Not on file    Relationship status: Not on file  . Intimate partner violence:    Fear of current or ex partner: Not on file    Emotionally abused: Not on file    Physically abused: Not on file    Forced sexual activity: Not on file  Other Topics Concern  . Not on file  Social History Narrative  . Not on file   Current Meds  Medication Sig  . [DISCONTINUED] cyclobenzaprine (FLEXERIL) 10 MG tablet Take 1 tablet (10 mg total) by mouth 3 (three) times daily.  . [DISCONTINUED] naproxen (NAPROSYN) 500 MG tablet Take 1 tablet (500 mg total) by  mouth 2 (two) times daily.   No Known Allergies    ROS: As per HPI, remainder of ROS negative.   OBJECTIVE:   Vitals:   11/21/17 1907  BP: 128/85  Resp: 17  Temp: 98.1 F (36.7 C)  TempSrc: Oral  SpO2: 100%     General appearance: alert; no distress Eyes: PERRL; EOMI; conjunctiva normal HENT: normocephalic; atraumatic; ; oral mucosa normal Neck: supple Lungs: clear to auscultation bilaterally Heart: regular rate and rhythm Abdomen: soft, non-tender; bowel sounds normal; no masses or organomegaly; no guarding or rebound tenderness Back: no CVA tenderness Extremities: no cyanosis or edema; symmetrical with no gross deformities; negative straight leg raising; no muscle mass asymmetry Skin: warm and dry Neurologic: normal gait; grossly normal Psychological: alert and cooperative; normal mood and affect      Labs:  Results for orders placed or performed during  the hospital encounter of 04/03/17  Basic metabolic panel  Result Value Ref Range   Sodium 139 135 - 145 mmol/L   Potassium 4.0 3.5 - 5.1 mmol/L   Chloride 105 101 - 111 mmol/L   CO2 26 22 - 32 mmol/L   Glucose, Bld 90 65 - 99 mg/dL   BUN 13 6 - 20 mg/dL   Creatinine, Ser 1.19 0.61 - 1.24 mg/dL   Calcium 9.4 8.9 - 14.7 mg/dL   GFR calc non Af Amer >60 >60 mL/min   GFR calc Af Amer >60 >60 mL/min   Anion gap 8 5 - 15  CBC  Result Value Ref Range   WBC 4.5 4.0 - 10.5 K/uL   RBC 5.48 4.22 - 5.81 MIL/uL   Hemoglobin 13.0 13.0 - 17.0 g/dL   HCT 82.9 56.2 - 13.0 %   MCV 71.5 (L) 78.0 - 100.0 fL   MCH 23.7 (L) 26.0 - 34.0 pg   MCHC 33.2 30.0 - 36.0 g/dL   RDW 86.5 78.4 - 69.6 %   Platelets 379 150 - 400 K/uL  I-stat troponin, ED  Result Value Ref Range   Troponin i, poc 0.00 0.00 - 0.08 ng/mL   Comment 3            Labs Reviewed - No data to display  No results found.     ASSESSMENT & PLAN:  1. Chronic low back pain without sciatica, unspecified back pain laterality   2. Gastritis and gastroduodenitis     Meds ordered this encounter  Medications  . cyclobenzaprine (FLEXERIL) 5 MG tablet    Sig: Take 1 tablet (5 mg total) by mouth at bedtime.    Dispense:  7 tablet    Refill:  0  . omeprazole (PRILOSEC) 20 MG capsule    Sig: Take 1 capsule (20 mg total) by mouth daily.    Dispense:  30 capsule    Refill:  0  Follow up with your chiropractor.  Reviewed expectations re: course of current medical issues. Questions answered. Outlined signs and symptoms indicating need for more acute intervention. Patient verbalized understanding. After Visit Summary given.    Procedures:      Elvina Sidle, MD 11/21/17 1926

## 2017-11-21 NOTE — Discharge Instructions (Addendum)
Follow up with your chiropractor.

## 2017-11-21 NOTE — ED Triage Notes (Addendum)
Patient states that he was seen on the 24th for back pain. Patient states that he needs doctors note and back brace. States that he went to chiropractor. States pain is in upper back. Patient states that he was supposed to go today to chiropractor but over slept his appt. 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   Patient has not filled the prescription for prednisone that he was rx'd on 4/24

## 2017-11-23 NOTE — ED Provider Notes (Signed)
[facility]ARE CENTER   [id number] 11/15/17 Arrival Time: 1920  ASSESSMENT & PLAN:  1. Muscle spasm of back     Meds ordered this encounter  Medications  . cyclobenzaprine (FLEXERIL) 10 MG tablet    Sig: Take 1 tablet (10 mg total) by mouth 3 (three) times daily.    Dispense:  20 tablet    Refill:  0  . predniSONE (STERAPRED UNI-PAK 21 TAB) 10 MG (21) TBPK tablet    Sig: Take by mouth daily. Take as directed.    Dispense:  21 tablet    Refill:  0   Medication sedation precautions. Will do his best to ensure ROM. May f/u here if not seeing improvement over the next several days to one week.  Reviewed expectations re: course of current medical issues. Questions answered. Outlined signs and symptoms indicating need for more acute intervention. Patient verbalized understanding. After Visit Summary given.   SUBJECTIVE: History from: patient.  Walter Gonzalez is a 27 y.o. male who presents with complaint of intermittent bilateral lower back discomfort. Onset gradual beginning a few days ago. Injury/trama: no. History of back problems: occasional lower back soreness. Previous back surgery: no. Discomfort described as soreness with tightness at times without radiation. Certain movements exacerbate the described discomfort. Better with rest. Extremity sensation changes or weakness: none. Ambulatory without difficulty. Normal bowel/bladder habits. No associated abdominal pain/n/v. Self treatment: tried OTCs without relief of pain.  Patient reports no fevers, IV drug use, recent back surgeries or procedures, urinary incontinence, or bowel incontinence.  ROS: As per HPI.   OBJECTIVE:  Vitals:   11/15/17 1943  BP: 125/78  Pulse: 69  Resp: 18  Temp: 97.9 F (36.6 C)  TempSrc: Oral  SpO2: 100%    General appearance: alert; no distress Neck: supple with FROM; without midline tenderness Lungs: unlabored respirations; symmetrical air entry Abdomen: soft, non-tender; bowel sounds  normal; no masses or organomegaly; no guarding or rebound tenderness Back: bilateral lower tenderness present over lumbar paraspinal musculature; FROM at hips with mild discomfort reported; bruising: none; without midline tenderness Extremities: no cyanosis or edema; symmetrical with no gross deformities Skin: warm and dry Neurologic: normal gait; normal symmetric reflexes; normal LE strength and sensation Psychological: alert and cooperative; normal mood and affect   No Known Allergies  Past Medical History:  Diagnosis Date  . ADHD (attention deficit hyperactivity disorder), predominantly hyperactive impulsive type   . Asthma    Social History   Socioeconomic History  . Marital status: Single    Spouse name: Not on file  . Number of children: Not on file  . Years of education: Not on file  . Highest education level: Not on file  Occupational History  . Not on file  Social Needs  . Financial resource strain: Not on file  . Food insecurity:    Worry: Not on file    Inability: Not on file  . Transportation needs:    Medical: Not on file    Non-medical: Not on file  Tobacco Use  . Smoking status: Former Games developer  . Smokeless tobacco: Never Used  Substance and Sexual Activity  . Alcohol use: Yes    Comment: sometimes  . Drug use: Yes    Frequency: 7.0 times per week    Types: Marijuana  . Sexual activity: Not on file  Lifestyle  . Physical activity:    Days per week: Not on file    Minutes per session: Not on file  . Stress:  Not on file  Relationships  . Social connections:    Talks on phone: Not on file    Gets together: Not on file    Attends religious service: Not on file    Active member of club or organization: Not on file    Attends meetings of clubs or organizations: Not on file    Relationship status: Not on file  . Intimate partner violence:    Fear of current or ex partner: Not on file    Emotionally abused: Not on file    Physically abused: Not on file      Forced sexual activity: Not on file  Other Topics Concern  . Not on file  Social History Narrative  . Not on file   Family History  Problem Relation Age of Onset  . Hypertension Mother   . Diabetes Other   . Hypertension Other    History reviewed. No pertinent surgical history.   Mardella Layman, MD 11/23/17 [phone]
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
## 2018-04-28 ENCOUNTER — Other Ambulatory Visit: Payer: Self-pay

## 2018-04-28 ENCOUNTER — Emergency Department (HOSPITAL_COMMUNITY)
Admission: EM | Admit: 2018-04-28 | Discharge: 2018-04-28 | Disposition: A | Discharge status: Home / Self Care | Payer: Self-pay | Attending: Emergency Medicine | Admitting: Emergency Medicine

## 2018-04-28 ENCOUNTER — Emergency Department (HOSPITAL_COMMUNITY): Payer: Self-pay

## 2018-04-28 ENCOUNTER — Encounter (HOSPITAL_COMMUNITY): Payer: Self-pay | Admitting: *Deleted

## 2018-04-28 DIAGNOSIS — R202 Paresthesia of skin: Secondary | ICD-10-CM | POA: Insufficient documentation

## 2018-04-28 DIAGNOSIS — J45909 Unspecified asthma, uncomplicated: Secondary | ICD-10-CM | POA: Insufficient documentation

## 2018-04-28 DIAGNOSIS — R072 Precordial pain: Secondary | ICD-10-CM | POA: Insufficient documentation

## 2018-04-28 DIAGNOSIS — F909 Attention-deficit hyperactivity disorder, unspecified type: Secondary | ICD-10-CM | POA: Insufficient documentation

## 2018-04-28 DIAGNOSIS — Z87891 Personal history of nicotine dependence: Secondary | ICD-10-CM | POA: Insufficient documentation

## 2018-04-28 DIAGNOSIS — M542 Cervicalgia: Secondary | ICD-10-CM | POA: Insufficient documentation

## 2018-04-28 LAB — CBC WITH DIFFERENTIAL/PLATELET
Abs Immature Granulocytes: 0 10*3/uL (ref 0.0–0.1)
Basophils Absolute: 0 10*3/uL (ref 0.0–0.1)
Basophils Relative: 1 %
Eosinophils Absolute: 0.2 10*3/uL (ref 0.0–0.7)
Eosinophils Relative: 5 %
HEMATOCRIT: 43.6 % (ref 39.0–52.0)
HEMOGLOBIN: 13.6 g/dL (ref 13.0–17.0)
Immature Granulocytes: 0 %
LYMPHS ABS: 1.6 10*3/uL (ref 0.7–4.0)
LYMPHS PCT: 46 %
MCH: 23.3 pg — ABNORMAL LOW (ref 26.0–34.0)
MCHC: 31.2 g/dL (ref 30.0–36.0)
MCV: 74.7 fL — ABNORMAL LOW (ref 78.0–100.0)
Monocytes Absolute: 0.4 10*3/uL (ref 0.1–1.0)
Monocytes Relative: 12 %
NEUTROS ABS: 1.3 10*3/uL — AB (ref 1.7–7.7)
Neutrophils Relative %: 36 %
Platelets: 356 10*3/uL (ref 150–400)
RBC: 5.84 MIL/uL — AB (ref 4.22–5.81)
RDW: 16 % — ABNORMAL HIGH (ref 11.5–15.5)
WBC: 3.5 10*3/uL — AB (ref 4.0–10.5)

## 2018-04-28 LAB — BASIC METABOLIC PANEL
Anion gap: 8 (ref 5–15)
BUN: 11 mg/dL (ref 6–20)
CHLORIDE: 104 mmol/L (ref 98–111)
CO2: 25 mmol/L (ref 22–32)
Calcium: 9.1 mg/dL (ref 8.9–10.3)
Creatinine, Ser: 1.02 mg/dL (ref 0.61–1.24)
GFR calc Af Amer: >60 mL/min (ref >60)
GFR calc non Af Amer: >60 mL/min (ref >60)
Glucose, Bld: 85 mg/dL (ref 70–99)
POTASSIUM: 4.2 mmol/L (ref 3.5–5.1)
SODIUM: 137 mmol/L (ref 135–145)

## 2018-04-28 LAB — TROPONIN I: Troponin I: <0.03 ng/mL (ref <0.03)

## 2018-04-28 MED ORDER — KETOROLAC TROMETHAMINE 30 MG/ML IJ SOLN
30.0000 mg | Freq: Once | INTRAMUSCULAR | Status: AC
  Administered 2018-04-28: 30 mg via INTRAVENOUS
  Filled 2018-04-28: qty 1

## 2018-04-28 MED ORDER — IOPAMIDOL (ISOVUE-370) INJECTION 76%
INTRAVENOUS | Status: AC
  Administered 2018-04-28: 50 mL
  Filled 2018-04-28: qty 50

## 2018-04-28 NOTE — ED Notes (Signed)
ED Provider at bedside. 

## 2018-04-28 NOTE — ED Provider Notes (Signed)
Emergency Department Provider Note   I have reviewed the triage vital signs and the nursing notes.   HISTORY  Chief Complaint Neck Pain; Back Pain; and Chest Pain   HPI Walter Gonzalez is a 27 y.o. male with PMH of asthma and ADHD presents to the emergency department for evaluation of neck, shoulder, chest pain.  Patient states he has had some subjective numbness over the left shoulder.  He went to a chiropractor and states he had some adjustments performed.  He denies any vertigo or off-balance symptoms after manipulation but did have some prior to that.  He reports intermittent chest pain which is occasionally left sided but also right-sided.  No modifying or provoking factors.  No fevers or chills.  Numbness does not extend past the shoulder.  No associated weakness.    Past Medical History:  Diagnosis Date  . ADHD (attention deficit hyperactivity disorder), predominantly hyperactive impulsive type   . Asthma     There are no active problems to display for this patient.   History reviewed. No pertinent surgical history.  Allergies Patient has no known allergies.  Family History  Problem Relation Age of Onset  . Hypertension Mother   . Diabetes Other   . Hypertension Other     Social History Social History   Tobacco Use  . Smoking status: Former Games developer  . Smokeless tobacco: Never Used  Substance Use Topics  . Alcohol use: Yes    Comment: sometimes  . Drug use: Yes    Frequency: 7.0 times per week    Types: Marijuana    Review of Systems  Constitutional: No fever/chills Eyes: No visual changes. ENT: No sore throat. Cardiovascular: Positive chest pain. Respiratory: Denies shortness of breath. Gastrointestinal: No abdominal pain.  No nausea, no vomiting.  No diarrhea.  No constipation. Genitourinary: Negative for dysuria. Musculoskeletal: Negative for back pain. Positive neck pain.  Skin: Negative for rash. Neurological: Negative for headaches, focal  weakness or numbness.  10-point ROS otherwise negative.  ____________________________________________   PHYSICAL EXAM:  VITAL SIGNS: ED Triage Vitals  Enc Vitals Group     BP 04/28/18 1103 118/84     Pulse Rate 04/28/18 1103 62     Resp 04/28/18 1103 17     Temp 04/28/18 1103 98 F (36.7 C)     Temp Source 04/28/18 1103 Oral     SpO2 04/28/18 1103 96 %     Weight 04/28/18 1103 162 lb (73.5 kg)     Height 04/28/18 1103 6\' 3"  (1.905 m)     Pain Score 04/28/18 1114 10   Constitutional: Alert and oriented. Well appearing and in no acute distress. Eyes: Conjunctivae are normal. PERRL.  Head: Atraumatic. Nose: No congestion/rhinnorhea. Mouth/Throat: Mucous membranes are moist.   Neck: No stridor. No midline cervical spine tenderness.  Cardiovascular: Normal rate, regular rhythm. Good peripheral circulation. Grossly normal heart sounds.   Respiratory: Normal respiratory effort.  No retractions. Lungs CTAB. Gastrointestinal: Soft and nontender. No distention.  Musculoskeletal: No lower extremity tenderness nor edema. No gross deformities of extremities. Neurologic:  Normal speech and language. No gross focal neurologic deficits are appreciated. Normal CN exam 2-12. No pronator drift. Normal strength and sensation in the upper and lower extremities.  Skin:  Skin is warm, dry and intact. No rash noted.  ____________________________________________   LABS (all labs ordered are listed, but only abnormal results are displayed)  Labs Reviewed  CBC WITH DIFFERENTIAL/PLATELET - Abnormal; Notable for the following components:  Result Value   WBC 3.5 (*)    RBC 5.84 (*)    MCV 74.7 (*)    MCH 23.3 (*)    RDW 16.0 (*)    Neutro Abs 1.3 (*)    All other components within normal limits  BASIC METABOLIC PANEL  TROPONIN I   ____________________________________________  EKG   EKG Interpretation  Date/Time:  Saturday April 28 2018 11:19:37 EDT Ventricular Rate:  58 PR  Interval:  160 QRS Duration: 84 QT Interval:  410 QTC Calculation: 402 R Axis:   84 Text Interpretation:  Sinus bradycardia No STEMI. Similar to Sep 2018 tracing.  Confirmed by Alona Bene [phone]) on 04/28/2018 12:43:50 PM       ____________________________________________  RADIOLOGY  Dg Chest 2 View  Result Date: 04/28/2018 CLINICAL DATA:  Bilateral chest pain, shortness of Breath EXAM: CHEST - 2 VIEW COMPARISON:  04/04/2015 FINDINGS: Heart and mediastinal contours are within normal limits. No focal opacities or effusions. No acute bony abnormality. IMPRESSION: No active cardiopulmonary disease. Electronically Signed   By: Charlett Nose M.D.   On: 04/28/2018 13:29   Ct Angio Neck W And/or Wo Contrast  Result Date: 04/28/2018 CLINICAL DATA:  27 year old male with severe neck pain. Bilateral extremity numbness. No known injury. EXAM: CT ANGIOGRAPHY NECK TECHNIQUE: Multidetector CT imaging of the neck was performed using the standard protocol during bolus administration of intravenous contrast. Multiplanar CT image reconstructions and MIPs were obtained to evaluate the vascular anatomy. Carotid stenosis measurements (when applicable) are obtained utilizing NASCET criteria, using the distal internal carotid diameter as the denominator. CONTRAST:  50mL ISOVUE-370 IOPAMIDOL (ISOVUE-370) INJECTION 76% COMPARISON:  Cervical spine radiographs 03/20/2016. FINDINGS: Skeleton: No acute osseous abnormality identified. Stable cervical vertebral height and alignment since 2017. There is a right paracentral disc protrusion evident at C3-C4 (series 5, image 52) where there may be mild spinal stenosis. Mild endplate spurring at that level. Mild disc bulging at C4-C5. Small central disc protrusion at C5-C6. Minimal disc bulging or protrusion at C6-C7. Upper chest: Normal lung apices.  Normal visible mediastinum. Other neck: Negative. Bilateral cervical lymph nodes are within normal limits for age. Other neck soft  tissue spaces appear normal. Negative visible brain parenchyma. Aortic arch: 3 vessel arch configuration. No arch atherosclerosis or abnormality. Right carotid system: Normal aside from mild tortuosity at the skull base. Normal visible right ICA siphon. Left carotid system: Normal aside from tortuosity at the skull base. Normal visible left ICA siphon. Vertebral arteries: Normal proximal right subclavian artery and right vertebral artery origin. Tortuous right V 2 segment at the right C4-C5 neural foramen. Otherwise normal right vertebral artery to the vertebrobasilar junction. The distal right V4 segment is fenestrated (series 7, image 33), normal variant. Normal visible basilar artery. Normal proximal left subclavian artery and left vertebral artery origin. Tortuous left vertebral artery at the left C4-C5 neural foramen. Otherwise normal left vertebral artery to the vertebrobasilar junction. Normal left PICA origin. Review of the MIP images confirms the above findings IMPRESSION: 1. Negative arterial findings on neck CTA; Mild ICA and vertebral artery tortuosity, Fenestrated right vertebral V4 segment (normal variant). 2. 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 Cervical spine MRI without contrast would best evaluate further. Electronically Signed   By: Odessa Fleming M.D.   On: 04/28/2018 13:59    ____________________________________________   PROCEDURES  Procedure(s) performed:   Procedures  None ____________________________________________  INITIAL IMPRESSION / ASSESSMENT AND PLAN / ED COURSE  Pertinent labs & imaging results that were available during my care of the patient were reviewed by me and considered in my medical decision making (see chart for details).  Patient presents to the emergency department with intermittent, daily chest pain with worsening pain in the neck and  back.  Pain appears to be musculoskeletal/radicular.  He does describe some numbness over the left shoulder but this is mainly subjective with normal sensation on my exam.  Suspect radicular symptoms.  Patient also with some report of vertigo/off-balance symptoms but this is not demonstrated on my exam today.  He has had some spine manipulation.  Plan for CT imaging to evaluate for traumatic dissection of the vasculature but my suspicion for this is very low.  We will also obtain troponin and chest x-ray.  The patient's EKG is similar to prior tracings and seems most consistent with J-point elevation.  CT, CXR, and labs reviewed. Radiology recommending MRI. With left shoulder numbness will obtain here in the ED. If negative, plan for Motrin and discharge home with PCP +/- Neurosurgery follow up.   Care transferred to Dr. Pilar Plate who will follow MRI.  ____________________________________________  FINAL CLINICAL IMPRESSION(S) / ED DIAGNOSES  Final diagnoses:  Neck pain  Precordial chest pain  Paresthesias     MEDICATIONS GIVEN DURING THIS VISIT:  Medications  ketorolac (TORADOL) 30 MG/ML injection 30 mg (30 mg Intravenous Given 04/28/18 1241)  iopamidol (ISOVUE-370) 76 % injection (50 mLs  Contrast Given 04/28/18 1315)    Note:  This document was prepared using Dragon voice recognition software and may include unintentional dictation errors.  Alona Bene, MD Emergency Medicine    Laneisha Mino, Arlyss Repress, MD 04/28/18 [phone]

## 2018-04-28 NOTE — ED Notes (Signed)
Patient verbalizes understanding of discharge instructions. Opportunity for questioning and answers were provided. Armband removed by staff, pt discharged from ED. Pt ambulatory to lobby with friend.  

## 2018-04-28 NOTE — Discharge Instructions (Addendum)
You were evaluated in the Emergency Department and after careful evaluation, we did not find any emergent condition requiring admission or further testing in the hospital.  Your symptoms today seem to be due to narrowing of your vertebral canal in your cervical spine, but there were no emergencies found on your MRI today.  We encourage you to follow-up with neurosurgery to further manage her symptoms.  Please return to the Emergency Department if you experience any worsening of your condition.  We encourage you to follow up with a primary care provider.  Thank you for allowing [place] to be a part of your care.

## 2018-04-28 NOTE — ED Triage Notes (Signed)
Pt states he has been having upper back, neck and collar bone pain.  Pt complains of chest pain.  Complains of arm and leg numbness.  Pt feels like swelling in neck and back.

## 2018-04-28 NOTE — ED Provider Notes (Signed)
  Provider Note MRN:  [id number]  Arrival date & time: 04/28/18    ED Course and Medical Decision Making  I received sign out for this patient at shift change from Dr. Jacqulyn Bath.  MRI with no myelopathy, some stenosis.  Patient requesting dc.  Referred to NSGY.  Elmer Sow. Pilar Plate, MD [facility] Emergency Medicine [facility] [email or web address]    Sabas Sous, MD 04/28/18 [phone]

## 2018-05-13 ENCOUNTER — Ambulatory Visit (HOSPITAL_COMMUNITY): Admission: EM | Admit: 2018-05-13 | Payer: Medicaid Other | Source: Home / Self Care
